# Patient Record
Sex: Male | Born: 1940
Health system: Southern US, Community
[De-identification: ages and names within clinical notes are randomized; demographics above are authoritative.]

## PROBLEM LIST (undated history)

## (undated) DIAGNOSIS — E785 Hyperlipidemia, unspecified: Secondary | ICD-10-CM

## (undated) DIAGNOSIS — K635 Polyp of colon: Secondary | ICD-10-CM

## (undated) DIAGNOSIS — Z87891 Personal history of nicotine dependence: Secondary | ICD-10-CM

## (undated) DIAGNOSIS — C61 Malignant neoplasm of prostate: Secondary | ICD-10-CM

## (undated) DIAGNOSIS — E119 Type 2 diabetes mellitus without complications: Secondary | ICD-10-CM

## (undated) DIAGNOSIS — J449 Chronic obstructive pulmonary disease, unspecified: Secondary | ICD-10-CM

## (undated) DIAGNOSIS — N529 Male erectile dysfunction, unspecified: Secondary | ICD-10-CM

## (undated) DIAGNOSIS — I2510 Atherosclerotic heart disease of native coronary artery without angina pectoris: Secondary | ICD-10-CM

## (undated) HISTORY — DX: Type 2 diabetes mellitus without complications: E11.9

## (undated) HISTORY — DX: Male erectile dysfunction, unspecified: N52.9

## (undated) HISTORY — DX: Personal history of nicotine dependence: Z87.891

## (undated) HISTORY — PX: CATARACT EXTRACTION, BILATERAL: SHX1313

## (undated) HISTORY — DX: Atherosclerotic heart disease of native coronary artery without angina pectoris: I25.10

## (undated) HISTORY — DX: Chronic obstructive pulmonary disease, unspecified: J44.9

## (undated) HISTORY — DX: Hyperlipidemia, unspecified: E78.5

## (undated) HISTORY — PX: PROSTATE BIOPSY: SHX241

## (undated) HISTORY — DX: Polyp of colon: K63.5

---

## 2002-04-18 DIAGNOSIS — D229 Melanocytic nevi, unspecified: Secondary | ICD-10-CM

## 2002-04-18 HISTORY — DX: Melanocytic nevi, unspecified: D22.9

## 2003-11-05 ENCOUNTER — Encounter: Admission: RE | Admit: 2003-11-05 | Discharge: 2003-11-05 | Payer: Self-pay | Admitting: Family Medicine

## 2004-01-26 DIAGNOSIS — K635 Polyp of colon: Secondary | ICD-10-CM

## 2004-01-26 HISTORY — DX: Polyp of colon: K63.5

## 2004-12-08 ENCOUNTER — Encounter (INDEPENDENT_AMBULATORY_CARE_PROVIDER_SITE_OTHER): Payer: Self-pay | Admitting: *Deleted

## 2004-12-08 ENCOUNTER — Ambulatory Visit (HOSPITAL_COMMUNITY): Admission: RE | Admit: 2004-12-08 | Discharge: 2004-12-08 | Payer: Self-pay | Admitting: Gastroenterology

## 2005-05-20 ENCOUNTER — Ambulatory Visit (HOSPITAL_COMMUNITY): Admission: RE | Admit: 2005-05-20 | Discharge: 2005-05-21 | Payer: Self-pay | Admitting: General Surgery

## 2007-04-26 DIAGNOSIS — Z87891 Personal history of nicotine dependence: Secondary | ICD-10-CM

## 2007-04-26 HISTORY — DX: Personal history of nicotine dependence: Z87.891

## 2007-05-12 ENCOUNTER — Ambulatory Visit (HOSPITAL_COMMUNITY): Admission: RE | Admit: 2007-05-12 | Discharge: 2007-05-12 | Payer: Self-pay | Admitting: Family Medicine

## 2007-05-26 HISTORY — PX: PERCUTANEOUS CORONARY STENT INTERVENTION (PCI-S): SHX6016

## 2007-06-07 ENCOUNTER — Inpatient Hospital Stay (HOSPITAL_BASED_OUTPATIENT_CLINIC_OR_DEPARTMENT_OTHER): Admission: RE | Admit: 2007-06-07 | Discharge: 2007-06-07 | Payer: Self-pay | Admitting: Cardiology

## 2007-06-09 ENCOUNTER — Encounter: Admission: RE | Admit: 2007-06-09 | Discharge: 2007-06-09 | Payer: Self-pay | Admitting: Interventional Cardiology

## 2007-06-09 ENCOUNTER — Ambulatory Visit (HOSPITAL_COMMUNITY): Admission: RE | Admit: 2007-06-09 | Discharge: 2007-06-10 | Payer: Self-pay | Admitting: Interventional Cardiology

## 2007-09-04 ENCOUNTER — Ambulatory Visit: Payer: Self-pay | Admitting: Internal Medicine

## 2007-09-05 ENCOUNTER — Inpatient Hospital Stay (HOSPITAL_COMMUNITY): Admission: EM | Admit: 2007-09-05 | Discharge: 2007-09-05 | Payer: Self-pay | Admitting: Emergency Medicine

## 2008-04-01 ENCOUNTER — Inpatient Hospital Stay (HOSPITAL_COMMUNITY): Admission: AD | Admit: 2008-04-01 | Discharge: 2008-04-02 | Payer: Self-pay | Admitting: Cardiology

## 2008-05-21 ENCOUNTER — Ambulatory Visit: Payer: Self-pay | Admitting: Cardiology

## 2008-08-06 DIAGNOSIS — C4491 Basal cell carcinoma of skin, unspecified: Secondary | ICD-10-CM

## 2008-08-06 HISTORY — DX: Basal cell carcinoma of skin, unspecified: C44.91

## 2009-06-22 ENCOUNTER — Emergency Department (HOSPITAL_COMMUNITY): Admission: EM | Admit: 2009-06-22 | Discharge: 2009-06-22 | Payer: Self-pay | Admitting: Emergency Medicine

## 2009-09-07 IMAGING — CR DG CHEST 2V
2 series · 2 of 2 positions shown · non-contrast
Comparison: PA and lateral chest 05/12/2007 and 05/13/2005.

CLINICAL DATA: Chest pain.

CHEST - 2 VIEW

[w chest pa]
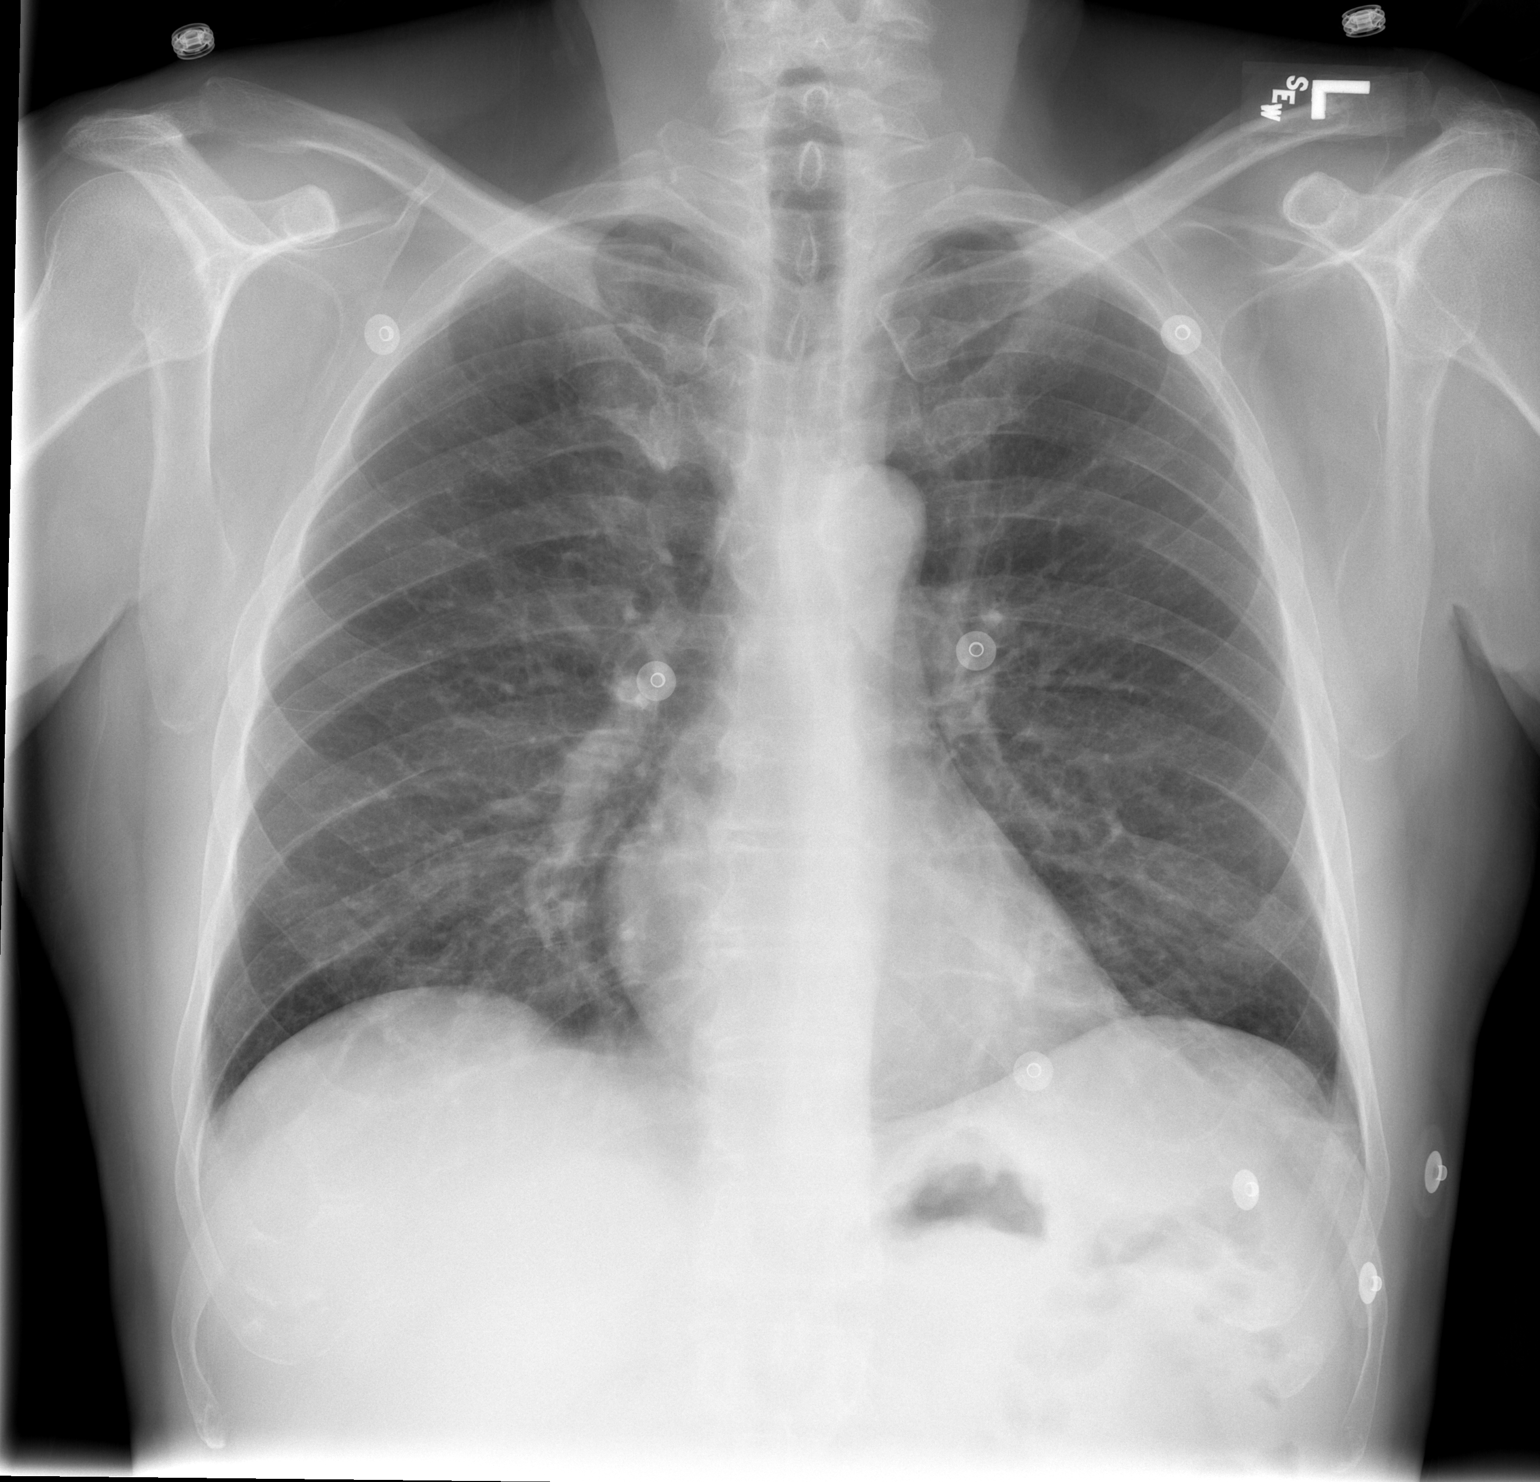

[w chest lat]
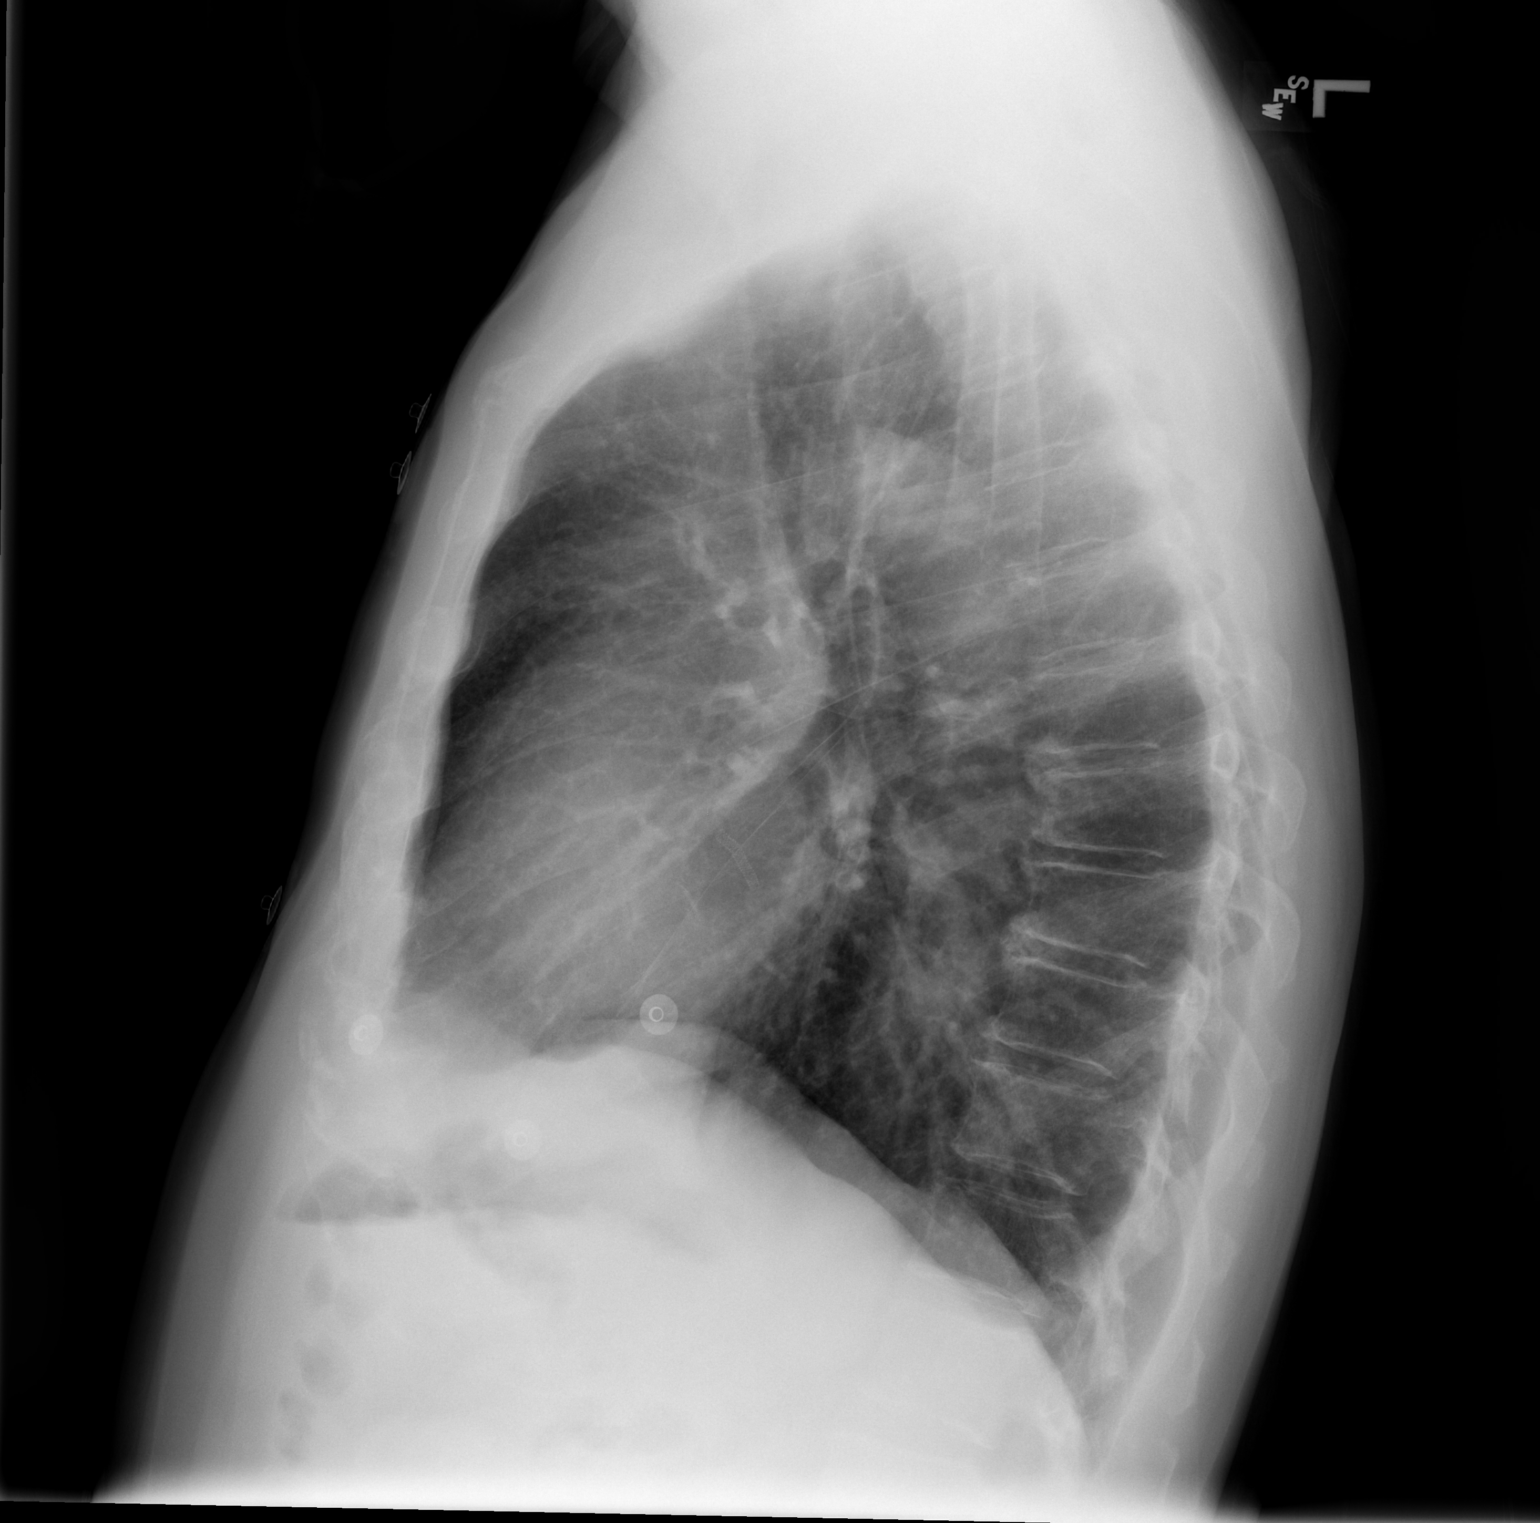

[2 of 2 positions shown; findings below may reference images not displayed]

FINDINGS: Changes of emphysema are again noted.  There is no focal
airspace disease or effusion.  Heart size normal.  No focal bony
abnormality.
IMPRESSION: Emphysema without acute disease.

## 2010-04-04 IMAGING — CR DG CHEST 1V PORT
1 series · 1 of 1 positions shown · non-contrast
Comparison: Chest x-ray of 09/05/2007

CLINICAL DATA: Chest pain

PORTABLE CHEST - 1 VIEW

[view not recorded]
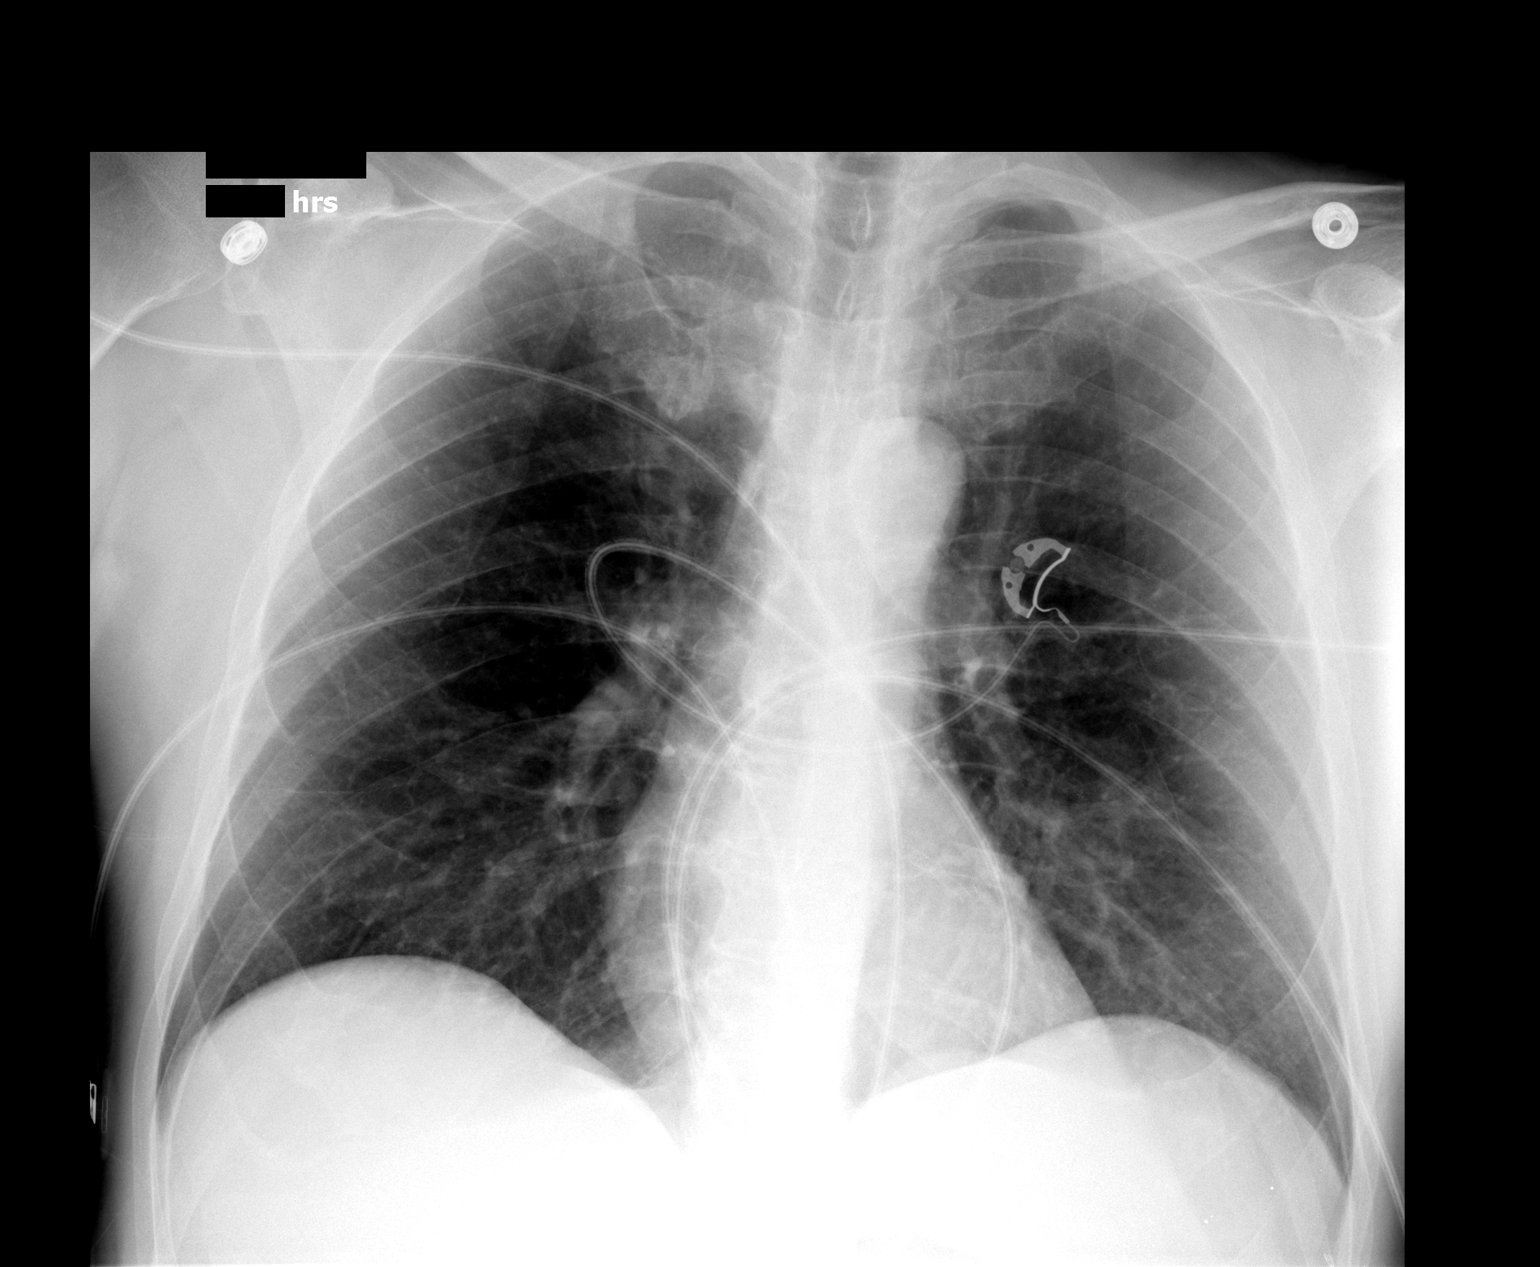

[1 of 1 positions shown; findings below may reference images not displayed]

FINDINGS: The lungs are clear but hyperaerated.  The heart is
within normal limits in size.  No bony abnormality is seen.
IMPRESSION: No active lung disease.  Hyperaeration.

## 2010-05-07 LAB — CBC
HCT: 40.6 % (ref 39.0–52.0)
Hemoglobin: 14.2 g/dL (ref 13.0–17.0)
Hemoglobin: 14.3 g/dL (ref 13.0–17.0)
MCHC: 35.3 g/dL (ref 30.0–36.0)
MCHC: 35.6 g/dL (ref 30.0–36.0)
MCV: 93.8 fL (ref 78.0–100.0)
Platelets: 227 10*3/uL (ref 150–400)
RBC: 4.25 MIL/uL (ref 4.22–5.81)
RDW: 11.6 % (ref 11.5–15.5)
WBC: 6.2 10*3/uL (ref 4.0–10.5)

## 2010-05-07 LAB — CARDIAC PANEL(CRET KIN+CKTOT+MB+TROPI)
CK, MB: 1.7 ng/mL (ref 0.3–4.0)
CK, MB: 3.1 ng/mL (ref 0.3–4.0)
Relative Index: INVALID (ref 0.0–2.5)
Total CK: 49 U/L (ref 7–232)
Troponin I: 0.14 ng/mL — ABNORMAL HIGH (ref 0.00–0.06)

## 2010-05-07 LAB — PROTIME-INR
INR: 2 — ABNORMAL HIGH (ref 0.00–1.49)
Prothrombin Time: 24.1 seconds — ABNORMAL HIGH (ref 11.6–15.2)

## 2010-05-07 LAB — COMPREHENSIVE METABOLIC PANEL
ALT: 41 U/L (ref 0–53)
Alkaline Phosphatase: 46 U/L (ref 39–117)
CO2: 22 mEq/L (ref 19–32)
Creatinine, Ser: 0.58 mg/dL (ref 0.4–1.5)
Glucose, Bld: 133 mg/dL — ABNORMAL HIGH (ref 70–99)
Total Bilirubin: 1.1 mg/dL (ref 0.3–1.2)

## 2010-05-07 LAB — APTT: aPTT: 89 seconds — ABNORMAL HIGH (ref 24–37)

## 2010-05-07 LAB — BASIC METABOLIC PANEL
CO2: 23 mEq/L (ref 19–32)
Glucose, Bld: 174 mg/dL — ABNORMAL HIGH (ref 70–99)

## 2010-05-07 LAB — MAGNESIUM: Magnesium: 2.1 mg/dL (ref 1.5–2.5)

## 2010-05-07 LAB — DIFFERENTIAL
Basophils Relative: 0 % (ref 0–1)
Lymphocytes Relative: 20 % (ref 12–46)
Lymphs Abs: 1.1 10*3/uL (ref 0.7–4.0)
Monocytes Absolute: 0.4 10*3/uL (ref 0.1–1.0)
Neutro Abs: 3.8 10*3/uL (ref 1.7–7.7)

## 2010-05-07 LAB — LIPID PANEL
Cholesterol: 105 mg/dL (ref 0–200)
HDL: 22 mg/dL — ABNORMAL LOW (ref >39)
Total CHOL/HDL Ratio: 4.8 RATIO
Triglycerides: 199 mg/dL — ABNORMAL HIGH (ref <150)

## 2010-05-07 LAB — GLUCOSE, CAPILLARY

## 2010-06-09 NOTE — Cardiovascular Report (Signed)
[name], [name]              ACCOUNT NO.:  [id number]   MEDICAL RECORD NO.:  [id number]          PATIENT TYPE:  INP   LOCATION:  2508                         FACILITY:  [facility]   PHYSICIAN:  Armanda Magic, M.D.     DATE OF BIRTH:  11-30-1940   DATE OF PROCEDURE:  04/02/2008  DATE OF DISCHARGE:  04/02/2008                            CARDIAC CATHETERIZATION   PROCEDURES:  1. Left heart catheterization.  2. Coronary angiography.  3. Left ventriculography.   OPERATOR:  Armanda Magic, MD   INDICATIONS:  Unstable angina.   COMPLICATIONS:  None.   INTRAVENOUS MEDICATIONS:  Versed 1 mg, fentanyl 25 mcg IV access via  right femoral artery 5-French sheath.   This is a 70 year old male who has a history of a PCI stent to the left  circumflex in May 2009, who presented back today with episodes of  exertional and rest angina, relieved with nitroglycerin.  He now  presents for cardiac catheterization.   The patient was brought to the cardiac catheterization laboratory in a  fasting nonsedated state.  An informed consent was obtained.  The  patient was connected to continuous heart rate and pulse oximetry  monitoring and written blood pressure monitoring.  The right groin was  prepped and draped in a sterile fashion.  Xylocaine 1% was used for  local anesthesia.  Using modified Seldinger technique, a 5-French sheath  was placed in right femoral artery.  Under fluoroscopic guidance, a 5-  [place] JL-4 catheter was placed in left coronary artery.  Multiple cine  films were taken at 30-degree RAO and 40-degree LAO views.  This  catheter was exchanged out over a guidewire for a 5-French JR-4  catheter, which was placed under fluoroscopic guidance in the right  coronary artery.  Multiple cine films were taken at 30-degree RAO and 40-  degree LAO views.  His catheter was then exchanged out over a guidewire  for a 5-French angled pigtail catheter, which was placed under  fluoroscopic guidance  in left ventricular cavity.  Left ventriculography  was performed in the 30-degree RAO view using total of 30 mL of contrast  at 15 mL per second.  The catheter was then pulled back across the  aortic valve with no significant gradient noted.  At the end of  procedure, the sheath was exchanged out over a guidewire for a 6-French  sheath and the patient was on to PCI of the left circumflex.   RESULTS:  The left main coronary artery is widely patent, bifurcates in  left anterior descending artery and left circumflex artery.   Left anterior descending artery is widely patent throughout its course  of the apex giving rise to a first diagonal, which is widely patent and  bifurcates into 2 daughter branches, both of which are widely patent.   The left circumflex is patent in its proximal portion giving rise to a  first obtuse marginal branch, which is a small branch.  Just proximal to  the mid left circumflex stent, there is a 99% stenosis of the left  circumflex.  The stent itself is widely patent and  distally gives rise  to a second obtuse marginal branch, which is widely patent.   The right coronary is widely patent and distally bifurcates into  posterior descending artery and posterior lateral artery, both of which  are widely patent.  1. Assess an one-vessel obstructive coronary artery disease to      proximal left circumflex.  2. Normal LV function.  3. Unstable angina.   PLAN:  PCI of the left circumflex, aspirin and Plavix.  Check fasting  lipid panel.      Armanda Magic, M.D.  Electronically Signed     TT/MEDQ  D:  04/02/2008  T:  04/02/2008  Job:  [id number]

## 2010-06-09 NOTE — H&P (Signed)
NAMECHANANYA, CANIZALEZ              ACCOUNT NO.:  [id number]   MEDICAL RECORD NO.:  [id number]          PATIENT TYPE:  INP   LOCATION:  3741                         FACILITY:  [facility]   PHYSICIAN:  Wendi Snipes, MD DATE OF BIRTH:  [birth year]   DATE OF ADMISSION:  09/04/2007  DATE OF DISCHARGE:                              HISTORY & PHYSICAL   CARDIOLOGIST:  Dr. Ladona Ridgel   CHIEF COMPLAINTS:  Chest pain.   HISTORY OF PRESENT ILLNESS:  This is a 70 year old white male with  history of coronary disease status post drug-eluting stent to the mid-  circ in May 2009 after he had a positive stress test.  He presents here  with acute onset of chest pain.  He states these symptoms are similar to  those which led to the stent in May and started this morning. He  describes chest pressure without radiation. There is some positional  element and there is a question of reproducibility. The symptoms  continued throughout the day and waxed and waned having brief periods of  severe chest pain.  He denies any associated shortness of breath,  diaphoresis, syncope, presyncope, increased lower extremity edema,  paroxysmal nocturnal dyspnea, orthopnea.  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  He was  prompted by his wife who recognized that his symptoms were similar to  his symptoms before.  He has been taking all his medications including  his Plavix and his stent placement   PAST MEDICAL HISTORY:  1. Coronary disease status post drug-eluting stent to the mid-circ and      into the OM2 May 2009 after exercise Cardiolite showed no evidence      of inducible ischemia.  However, his EKG had evidence of ischemia.  2. Diabetes, currently diet-controlled.  3. Hyperlipidemia/hypertriglyceridemia.   SOCIAL HISTORY:  The patient was in [place].  He does not smoke.  Denies alcohol  use.   FAMILY HISTORY:  Significant premature coronary artery disease.   REVIEW OF SYSTEMS:  All 14 systems were reviewed and were negative  except as mentioned in HPI.   MEDICATIONS ON ADMISSION:  1. Plavix 75 mg daily.  2. CREST 04/05 mg daily.  3. Aspirin.  25 mg daily.  4. Sublingual nitroglycerin as directed.   PHYSICAL EXAMINATION:  VITAL SIGNS:  Blood pressure was 117/67,  respiratory rate 18, pulse 60 and regular.  GENERAL:  He is a 70 year old white male appearing stated age.  No acute  distress.  HEENT:  Moist mucous membranes.  Pupils equal round, react to light and  accommodation.  Anicteric sclera.  NECK:  No jugular venous distention.  No thyromegaly.  CARDIOVASCULAR:  Regular rate and rhythm.  No murmurs, rubs, gallops.  LUNGS:  Clear to auscultation bilaterally and nontender, nondistended.  Positive bowel sounds.  No masses.  EXTREMITIES:  No clubbing, cyanosis, edema, 2+ pulses throughout.  SKIN:  Warm, dry, intact.  No rashes.  NEUROLOGICAL:  Alert and oriented x3.  Cranial nerves II-XII grossly  intact.  No focal neurologic deficits.  Psych, mood and affect  appropriate.   STUDIES:  Radiology is pending.  EKG showed normal sinus rhythm with no  ST or T-wave abnormalities suggestive of any recent ongoing ischemia.   LABORATORY:  White blood cell count is 5.5, hematocrit 41, platelets  255.  Comprehensive metabolic panel was pending.  Troponin 0.02.   ASSESSMENT/PLAN:  This is a 70 year old white male with a recent drug-  eluting stent placement with symptoms concerning for unstable angina.  1. Unstable Angina. Will continue his current aspirin and Plavix and  treat per ACS protocol with beta-blocker, ACE inhibitor and full-dose  heparin.  This is very unlikely stent thrombosis.  However, we will  cycle cardiac enzymes, rule out MI.  Consider repeating his Cardiolite  stress test in the morning.  However, if the symptoms continue may  benefit from a cardiac  cath to assess in-stent restenosis which is also  very unlikely.  His symptoms are also possibly musculoskeletal and  elicit reproducibility on exam.      Wendi Snipes, MD  Electronically Signed     BHH/MEDQ  D:  09/05/2007  T:  09/05/2007  Job:  [id number]

## 2010-06-09 NOTE — Discharge Summary (Signed)
[name], [name]              ACCOUNT NO.:  [id number]   MEDICAL RECORD NO.:  [id number]          PATIENT TYPE:  INP   LOCATION:  2508                         FACILITY:  [facility]   PHYSICIAN:  Armanda Magic, M.D.     DATE OF BIRTH:  1940/06/20   DATE OF ADMISSION:  04/01/2008  DATE OF DISCHARGE:  04/02/2008                               DISCHARGE SUMMARY   DISCHARGE DIAGNOSES:  1. Single-vessel coronary artery disease, status post drug-eluting      stent to the proximal circumflex.  2. Dyslipidemia.  3. Diet-controlled diabetes.  4. Prior history of tobacco abuse, smoking cessation counseling.   HOSPITAL COURSE:  [name] is a 70 year old male patient who came  into the office on April 01, 2008, complaining of bilateral chest pain.  Sublingual nitroglycerin helped it.  It is worse with activity and he  has had accompanying shortness of breath with this.  He stated that  these symptoms were not similar to his previous stent in the past.   We went ahead and admitted him to the hospital for cardiac  catheterization.  On April 01, 2008, he was found to have a subtotaled  circumflex lesion.  Dr. Eldridge Dace then placed a drug-eluting stent to the  proximal circumflex lesion and after that the patient remained on  aspirin and Plavix for at least 1 year.  The patient went home in the  following day in stable, but improved condition.   DISCHARGE LABORATORIES:  Maximum CK 55 with MB fraction of 2.8, and  troponin 0.14.  Sodium 137, potassium 4.2, BUN 8, and creatinine 0.68.  Total cholesterol 105, triglycerides 199, HDL 22, and LDL 43.  Hemoglobin 14.3, hematocrit 40.6, platelets 227, and white count 6.2.  TSH 4.514 and magnesium 2.1.   Chest x-ray:  No active lung disease, hyperaeration.   DISCHARGE MEDICATIONS:  1. Plavix 75 mg 1 p.o. daily.  2. Aspirin 325 mg.  3. Crestor 5 mg a day.   Remain on low-sodium, heart-healthy diet.  Increase activity slowly.  No  lifting for 1 week.   No driving for 1-day.  Follow up with Dr. Mayford Knife  for fasting lab work on April 17, 2008, at 9:45 a.m. and then to see her  at 10:45 the same day.      Guy Franco, P.A.      Armanda Magic, M.D.  Electronically Signed    LB/MEDQ  D:  05/16/2008  T:  05/17/2008  Job:  [id number]   cc:   Jethro Bastos, M.D.

## 2010-06-09 NOTE — Cardiovascular Report (Signed)
NAMEJEROD, MCQUAIN              ACCOUNT NO.:  [id number]   MEDICAL RECORD NO.:  [id number]          PATIENT TYPE:  OIB   LOCATION:  1961                         FACILITY:  [facility]   PHYSICIAN:  Armanda Magic, M.D.     DATE OF BIRTH:  08-Feb-1940   DATE OF PROCEDURE:  06/07/2007  DATE OF DISCHARGE:  06/07/2007                            CARDIAC CATHETERIZATION   REFERRING PHYSICIAN:  Jethro Bastos, MD   PROCEDURE:  1. Left heart catheterization.  2. Coronary angiography.  3. Left ventriculography.   OPERATOR:  Armanda Magic, MD   INDICATIONS:  Chest pain.   COMPLICATIONS:  None.   IV MEDICATIONS:  Fentanyl 25 mcg, Versed 1 mg IV access via right  femoral artery 4-French sheath.   This is a 70 year old male with a 4-6 week history of intermittent upper  chest pain described as an aching sensation usually lasting about 5  minutes at a time.  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  He underwent a stress Cardiolite study which  showed no inducible ischemia by SPECT edges but was positive by EKG and  the patient did have 3/10 chest pain during the stress test.  He now  presents for cardiac catheterization.   The patient was brought to the cardiac catheterization laboratory in a  fasting nonsedated state.  Informed consent was obtained.  The patient  was connected to continuous heart rate and pulse oximetry monitoring and  intermittent blood pressure monitoring.  The right groin was prepped and  draped in sterile fashion.  1% Xylocaine was used for local anesthesia.  Using the modified Seldinger technique, a 4-French sheath was placed in  the right femoral artery.  Under fluoroscopic guidance, a 4-French JL-4  catheter was placed in the left coronary artery.  Multiple cine films  were taken at 30-degree RAO and 40-degree LAO views.  This catheter was  then exchanged out over a guidewire for 4-French 3D right coronary  artery catheter.  Multiple  cine films were taken at 30-degree RAO and 40-  degree LAO views.  This catheter was then exchanged out over a guidewire  for a 4-French angled pigtail catheter which was placed in the left  ventricular cavity.  Left ventriculography was performed in the 30-  degree RAO view using a total of 30 mL of contrast at 15 mL per second.  The catheter was then pulled back across the aortic valve with no  significant gradient noted.  At the end of the procedure, all catheters  and sheaths were removed.  Manual compression was performed.  Adequate  hemostasis was obtained.  After adequate hemostasis was obtained, the  patient was transferred back to room in stable condition.  Of note, the  patient did develop some chest pain after being taken off the table.  EKG was done which showed normal sinus rhythm with no ST changes.  The  patient was given 1 sublingual nitroglycerin with resolution of his  chest pain.   RESULTS:  1. Left main coronary artery is widely patent and bifurcates in the  left anterior descending artery and left circumflex artery.   Left anterior descending artery is widely patent throughout the course  of the apex giving rise to a first diagonal branch.  The first diagonal  branch bifurcates in 2 daughter branches, both of which are widely  patent.   The left circumflex is widely patent throughout its course proximally  giving rise to a first obtuse marginal branch which is widely patent.  The mid left circumflex has a 95% stenosis.  It then gives rise to a  second obtuse marginal branch which has a 90% mid stenosis before  bifurcating in 2 daughter branches.  The ongoing circumflex is patent  but small.   1. The right coronary artery is widely patent throughout its course.      It distally bifurcates into posterior descending artery and      posterior lateral artery both of which are widely patent.   1. Left ventriculography shows normal LV function.  LV pressure  118/10      mmHg, aortic pressure 115/64 mmHg, LVEDP 12 mmHg.   ASSESSMENT:  1. One-vessel obstructive coronary disease.  The left circumflex SPECT      images on stress Cardiolite study showed no inducible ischemia.      EKG was positive for ischemia.  The patient is now pain free after      having an episode of chest pain resolved with 1 sublingual      nitroglycerin and no EKG changes noted.  2. Normal left ventricular function.  3. Chest pain.  4. Diabetes mellitus.   PLAN:  1. Discharge to home.  2. We will put him on Plavix 75 mg a day and aspirin 325 mg a day as      well as Imdur 30 mg a day and send him home with sublingual      nitroglycerin.  3. He will be scheduled tomorrow, May 14, for PCI of the left      circumflex by Dr. Eldridge Dace.  He has been informed that if he has      any chest pain while at home today or tonight that is not relieved      after 3 sublingual nitroglycerins, then he is to call 911.      Armanda Magic, M.D.  Electronically Signed     TT/MEDQ  D:  06/07/2007  T:  06/08/2007  Job:  [id number]   cc:   Jethro Bastos, M.D.

## 2010-06-09 NOTE — Discharge Summary (Signed)
NAMEDEONTRA, PEREYRA              ACCOUNT NO.:  [id number]   MEDICAL RECORD NO.:  [id number]          PATIENT TYPE:  INP   LOCATION:  3741                         FACILITY:  [facility]   PHYSICIAN:  Doylene Canning. Ladona Ridgel, MD    DATE OF BIRTH:  [birth year]   DATE OF ADMISSION:  09/04/2007  DATE OF DISCHARGE:  09/05/2007                               DISCHARGE SUMMARY   DISCHARGE DIAGNOSES:  1. Chest pain, resolved.  2. Known coronary artery disease.  3. Dyslipidemia.  4. Diabetes mellitus, diet controlled.  5. Hyperlipidemia.  6. Long-term medication use.   HOSPITAL COURSE:  [name] is a 70 year old male patient with a  known history of coronary artery disease.  He received a drug-eluting  stent to the circumflex in May 2009 after positive stress test.  He  complains of chest pain similar to that which led to the stent.  Chest  pain started on the morning September 04, 2007, and waxed and waned all  day.  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  He ended up coming to the  emergency room and he remained in the hospital overnight.  His cardiac  enzymes were negative x3.  Because his cardiac enzymes were negative x3,  his EKG is nonischemic and the fact that he is playing the fiddle for 3  hours in an uncomfortable position, it was felt that it was safe for him  to go home with a follow up with his stress Cardiolite on this Friday.   The patient is to go home on his current medication regimen which  includes:  1. Plavix 75 mg a day.  2. Aspirin 325 mg a day.  3. Crestor 5 mg a day.  4. Sublingual nitroglycerin p.r.n. chest pain.   ACTIVITY:  As tolerated.   DIET:  Remain on low-sodium, heart-healthy diabetic diet.   The patient is to return for a stress test on September 08, 2007, at 12:30  p.m.  The patient is not to eat before the test and no caffeine for 24  hours before the study.   DISCHARGE TIME:  Greater  than 30 minutes.      Guy Franco, P.A.      Doylene Canning. Ladona Ridgel, MD  Electronically Signed    LB/MEDQ  D:  09/05/2007  T:  09/06/2007  Job:  [id number]   cc:   Armanda Magic, M.D.

## 2010-06-12 NOTE — Op Note (Signed)
[name], [name]              ACCOUNT NO.:  [id number]   MEDICAL RECORD NO.:  [id number]          PATIENT TYPE:  OIB   LOCATION:  1512                         [facility]ACILITY:  [facility]   PHYSICIAN:  Adolph Pollack, M.D.DATE OF BIRTH:  07-10-1940   DATE OF PROCEDURE:  05/20/2005  DATE OF DISCHARGE:                                 OPERATIVE REPORT   PREOP DIAGNOSIS:  Bilateral inguinal hernias with a right side being  recurrent.   POSTOPERATIVE DIAGNOSIS:  Bilateral inguinal hernias with a right side being  recurrent.   PROCEDURE:  Laparoscopic bilateral inguinal hernia repair with mesh (right  side recurrent).   SURGEON:  Adolph Pollack, M.D.   ASSISTANT:  None.   ANESTHESIA:  General.   INDICATION:  [name] is a 70 year old male who had a right inguinal  hernia repair back in 1998 with mesh.  He has been noted to have recurrent  right inguinal hernia that is somewhat symptomatic and he had left inguinal  pain and also had a simple left inguinal hernia that was detected on exam.  He now presents for repair as above.  We discussed the procedure, the risks  and aftercare preoperatively.   TECHNIQUE:  He is seen in the holding area and brought to the operating  room, placed supine on the operating table.  General anesthetic was  administered.  The hair on the abdominal wall was clipped and the area  sterilely prepped and draped.  Dilute Marcaine solution was infiltrated in  the subumbilical region and a subumbilical incision was made through skin  and subcutaneous tissue.  Using blunt dissection I identified the left  anterior rectus sheath and made a small incision in it.  The underlying  rectus muscle was swept laterally using blunt dissection exposing the  posterior rectus sheath.  A balloon dissection device was then placed in the  extraperitoneal space under laparoscopic vision.  The balloon dissection was  performed.   The balloon dissection device was removed.   The CO2 gas was insufflated  through the trocar and laparoscope was introduced and the extraperitoneal  space visualized.  Under direct vision two 5 mm trocars were placed through  lower midline incisions.  We used blunt dissection to expose the symphysis  pubis and Cooper's ligament bilaterally.  I then used blunt dissection to  dissect fibrofatty tissue free from the anterior and lateral abdominal wall  at the level of the umbilicus on the left side.  I noted a direct hernia of  the left side.  I then isolated the spermatic cord.  I did strip the  peritoneum back from the cord to the level of the umbilicus.   Following this, I approached the right side.  I began using blunt dissection  to dissect the fibrofatty tissue away from the anterior lateral abdominal  wall up to level of the umbilicus.  A direct defect was noted.  I then  stripped the peritoneum off the spermatic cord but a hole was made in the  peritoneum.  I was able to strip the peritoneum off the cord and then pull  the anterior portion of the peritoneum over to cover the defect and  peritoneum.  I did insert a Veress needle through the periumbilical incision  to release some of the pneumoperitoneum.   The spermatic cord was then visualized.  The spermatic cord was then  isolated and a window created around it as had been done on the left side.  I reapproached the left side.  I brought a 6x6 inch piece of mesh and cut it  to be 5x6.  I then cut a longitudinal slit partially through the mesh  inserted into the left extraperitoneal space.  I then positioned it so that  the two tails of the mesh were wrapped around the spermatic cord.  I then  anchored the mesh to the Cooper's ligament anterior lateral abdominal walls  with spiral tacks which provided more than adequate coverage of the direct  defect as well as indirect and femoral spaces.   I then took another piece of mesh, cut a partial longitudinal slit into and  cut it  to be 5x6 inches.  It was then inserted into the right  extraperitoneal space and positioned as it was on the left side.  I anchored  it to Cooper's ligament, the anterior and lateral abdominal walls with  spiral tacks.  This again provided more than adequate coverage of the  direct, indirect and femoral spaces.   Following this I noted hemostasis to be adequate.  I then held down the  inferolateral aspects of both pieces of mesh and then released the CO2 gas.  I then removed all trocars and instruments.   The left anterior rectus sheath defect was then closed with interrupted 0  Vicryl sutures.  The skin incisions were closed with 4-0 Monocryl  subcuticular stitches followed by Steri-Strips and sterile dressings.  He  tolerated the procedure well without apparent complications was taken to  recovery in satisfactory condition.      Adolph Pollack, M.D.  Electronically Signed     TJR/MEDQ  D:  05/20/2005  T:  05/21/2005  Job:  [id number]   cc:   Schuyler Amor, M.D.  Fax: [phone]

## 2010-10-21 LAB — BASIC METABOLIC PANEL
BUN: 6
Calcium: 9.3
Chloride: 105
Creatinine, Ser: 0.73
GFR calc non Af Amer: >60
Glucose, Bld: 139 — ABNORMAL HIGH
Sodium: 139

## 2010-10-21 LAB — LIPID PANEL
Cholesterol: 188
HDL: 31 — ABNORMAL LOW
Total CHOL/HDL Ratio: 6.1
VLDL: 70 — ABNORMAL HIGH

## 2010-10-21 LAB — CBC
MCHC: 35.2
MCV: 92.7
RBC: 4.3

## 2010-10-21 LAB — CARDIAC PANEL(CRET KIN+CKTOT+MB+TROPI)
CK, MB: 1.2
Relative Index: INVALID
Total CK: 30

## 2010-10-21 LAB — HEMOGLOBIN A1C
Hgb A1c MFr Bld: 7 — ABNORMAL HIGH
Mean Plasma Glucose: 172

## 2010-10-23 LAB — CARDIAC PANEL(CRET KIN+CKTOT+MB+TROPI)
CK, MB: 2.8
Relative Index: INVALID
Total CK: 64

## 2010-10-23 LAB — COMPREHENSIVE METABOLIC PANEL
ALT: 36
AST: 23
Calcium: 8.7
GFR calc Af Amer: >60
Sodium: 137
Total Protein: 6.1

## 2010-10-23 LAB — CBC
HCT: 41.6
Hemoglobin: 14.3
MCHC: 34.4
MCV: 94.1
Platelets: 255
RBC: 4.42
RDW: 11.6
WBC: 5.5

## 2010-10-23 LAB — DIFFERENTIAL
Basophils Absolute: 0
Basophils Relative: 1
Eosinophils Absolute: 0.3
Eosinophils Relative: 6 — ABNORMAL HIGH
Lymphocytes Relative: 38
Lymphs Abs: 2.1
Monocytes Absolute: 0.6
Monocytes Relative: 10
Neutro Abs: 2.5
Neutrophils Relative %: 45

## 2010-10-23 LAB — HEPARIN LEVEL (UNFRACTIONATED): Heparin Unfractionated: 0.71 — ABNORMAL HIGH

## 2010-10-23 LAB — CK TOTAL AND CKMB (NOT AT ARMC)
CK, MB: 2.8
Relative Index: INVALID
Total CK: 91

## 2010-10-23 LAB — POCT CARDIAC MARKERS
CKMB, poc: 2.1
Myoglobin, poc: 61.5
Troponin i, poc: <0.05

## 2010-10-23 LAB — APTT: aPTT: >200

## 2010-10-23 LAB — TROPONIN I: Troponin I: 0.02

## 2011-03-02 ENCOUNTER — Other Ambulatory Visit: Payer: Self-pay | Admitting: Physician Assistant

## 2011-12-07 ENCOUNTER — Other Ambulatory Visit: Payer: Self-pay | Admitting: Gastroenterology

## 2011-12-07 DIAGNOSIS — R1310 Dysphagia, unspecified: Secondary | ICD-10-CM

## 2011-12-20 ENCOUNTER — Ambulatory Visit
Admission: RE | Admit: 2011-12-20 | Discharge: 2011-12-20 | Disposition: A | Discharge status: Home / Self Care | Payer: Medicare Other | Source: Ambulatory Visit | Attending: Gastroenterology | Admitting: Gastroenterology

## 2011-12-20 DIAGNOSIS — R1310 Dysphagia, unspecified: Secondary | ICD-10-CM

## 2012-11-30 ENCOUNTER — Ambulatory Visit: Payer: Medicare Other | Admitting: Cardiology

## 2012-11-30 ENCOUNTER — Other Ambulatory Visit: Payer: Medicare Other

## 2012-12-20 ENCOUNTER — Encounter: Payer: Self-pay | Admitting: General Surgery

## 2012-12-20 DIAGNOSIS — E7800 Pure hypercholesterolemia, unspecified: Secondary | ICD-10-CM | POA: Insufficient documentation

## 2012-12-20 DIAGNOSIS — Z79899 Other long term (current) drug therapy: Secondary | ICD-10-CM | POA: Insufficient documentation

## 2012-12-20 DIAGNOSIS — I2510 Atherosclerotic heart disease of native coronary artery without angina pectoris: Secondary | ICD-10-CM

## 2012-12-25 ENCOUNTER — Encounter (INDEPENDENT_AMBULATORY_CARE_PROVIDER_SITE_OTHER): Payer: Self-pay

## 2012-12-25 ENCOUNTER — Other Ambulatory Visit: Payer: Medicare Other

## 2012-12-25 ENCOUNTER — Ambulatory Visit (INDEPENDENT_AMBULATORY_CARE_PROVIDER_SITE_OTHER): Payer: Medicare Other | Admitting: Cardiology

## 2012-12-25 ENCOUNTER — Encounter: Payer: Self-pay | Admitting: Cardiology

## 2012-12-25 VITALS — BP 118/68 | HR 67 | Ht 68.0 in | Wt 145.0 lb

## 2012-12-25 DIAGNOSIS — E7800 Pure hypercholesterolemia, unspecified: Secondary | ICD-10-CM

## 2012-12-25 DIAGNOSIS — I2510 Atherosclerotic heart disease of native coronary artery without angina pectoris: Secondary | ICD-10-CM

## 2012-12-25 DIAGNOSIS — R002 Palpitations: Secondary | ICD-10-CM

## 2012-12-25 NOTE — Patient Instructions (Signed)
Your physician recommends that you continue on your current medications as directed. Please refer to the Current Medication list given to you today.  Your physician recommends that you return for fasting lab work on 01/02/13.  Your physician wants you to follow-up in: 1 Year with Dr Sherlyn Lick will receive a reminder letter in the mail two months in advance. If you don't receive a letter, please call our office to schedule the follow-up appointment.

## 2012-12-25 NOTE — Progress Notes (Signed)
[address] [place], [place]  [address] Phone: [phone] Fax:  [phone]  Date:  12/25/2012   ID:  [name], DOB [birth year], MRN [id number]  PCP:  Darrow Bussing, MD  Cardiologist:  Armanda Magic, MD     History of Present Illness: [name] is a 72 y.o. male with a history of ASCAD and dyslipidemia.  He is doing well  He denies any chest pain, SOB, DOE, LE edema, dizziness or syncope.  He says for about 4-5 days in a row he had palpitations out of nowhere.  They have been intermittent throughout the day.  He has not noticed them in the past 24 hours.  He thinks he may have been drinking too much caffeine lately.   Wt Readings from Last 3 Encounters:  12/25/12 145 lb (65.772 kg)  12/20/12 138 lb 3.2 oz (62.687 kg)     Past Medical History  Diagnosis Date  . Dyslipidemia   . Diabetes mellitus without complication     Diet-controlled non-insulin-dependent  . History of tobacco abuse 04/2007    COPD on CXR  . Erectile dysfunction   . Colon polyp 2006    repeat in 2 years  . CAD (coronary artery disease)     One vessel CAD status post PCI of left circumflex aqnd to the second obtuse marginal 2009 and PCI of left circ 03/2008 for stenosis above the prior stent  . Elevated cholesterol   . COPD (chronic obstructive pulmonary disease)     Current Outpatient Prescriptions  Medication Sig Dispense Refill  . Ascorbic Acid (VITAMIN C) 100 MG tablet Take 100 mg by mouth every other day.      Marland Kitchen aspirin EC 81 MG tablet Take 81 mg by mouth daily.      . clopidogrel (PLAVIX) 75 MG tablet Take 75 mg by mouth daily with breakfast.      . lisinopril (PRINIVIL,ZESTRIL) 2.5 MG tablet Take 2.5 mg by mouth daily.      . metFORMIN (GLUCOPHAGE) 500 MG tablet Take 1,000 mg by mouth 2 (two) times daily with a meal.      . Multiple Vitamin (MULTIVITAMIN WITH MINERALS) TABS tablet Take 1 tablet by mouth daily.      . nitroGLYCERIN (NITROSTAT) 0.4 MG SL tablet Place 0.4 mg  under the tongue every 5 (five) minutes as needed for chest pain.      . rosuvastatin (CRESTOR) 20 MG tablet Take 20 mg by mouth daily.      . sildenafil (VIAGRA) 50 MG tablet Take 50 mg by mouth daily as needed for erectile dysfunction.      . Vitamin D, Cholecalciferol, 1000 UNITS TABS Take 1 tablet by mouth daily.       No current facility-administered medications for this visit.    Allergies:    Allergies  Allergen Reactions  . Sulfa Antibiotics Other (See Comments)    Severe Headaches  . Zocor [Simvastatin] Other (See Comments)    Aches and myalgias    Social History:  The patient  reports that he quit smoking about 4 years ago. His smoking use included Cigarettes. He smoked 0.00 packs per day. He has never used smokeless tobacco. He reports that he drinks alcohol. He reports that he does not use illicit drugs.   Family History:  The patient's family history is not on file.   ROS:  Please see the history of present illness.      All other systems reviewed  and negative.   PHYSICAL EXAM: VS:  BP 118/68  Pulse 67  Ht 5\' 8"  (1.727 m)  Wt 145 lb (65.772 kg)  BMI 22.05 kg/m2 Well nourished, well developed, in no acute distress HEENT: normal Neck: no JVD Cardiac:  normal S1, S2; RRR; no murmur Lungs:  clear to auscultation bilaterally, no wheezing, rhonchi or rales Abd: soft, nontender, no hepatomegaly Ext: no edema Skin: warm and dry Neuro:  CNs 2-12 intact, no focal abnormalities noted  EKG:     NSR  ASSESSMENT AND PLAN:  1. ASCAD with no angina  - continue ASA/Plavix 2. dyslipdiemia  - continue Crestor  - recheck NMR panel and ALT 3.  Palpitations  - He would like to avoid caffeine first and see if he has any reoccurrence before doing a heart monitor  Followup with me in 1 year  Signed, Armanda Magic, MD 12/25/2012 2:49 PM

## 2013-01-02 ENCOUNTER — Other Ambulatory Visit (INDEPENDENT_AMBULATORY_CARE_PROVIDER_SITE_OTHER): Payer: Medicare Other

## 2013-01-02 DIAGNOSIS — E7800 Pure hypercholesterolemia, unspecified: Secondary | ICD-10-CM

## 2013-01-02 LAB — ALT: ALT: 23 U/L (ref 0–53)

## 2013-01-03 ENCOUNTER — Other Ambulatory Visit: Payer: Self-pay | Admitting: General Surgery

## 2013-01-03 ENCOUNTER — Encounter: Payer: Self-pay | Admitting: General Surgery

## 2013-01-03 DIAGNOSIS — I2510 Atherosclerotic heart disease of native coronary artery without angina pectoris: Secondary | ICD-10-CM

## 2013-01-03 DIAGNOSIS — Z79899 Other long term (current) drug therapy: Secondary | ICD-10-CM

## 2013-01-03 LAB — NMR LIPOPROFILE WITH LIPIDS
HDL Size: 8.6 nm — ABNORMAL LOW (ref >=9.2)
HDL-C: 31 mg/dL — ABNORMAL LOW (ref >=40)
LDL (calc): 48 mg/dL (ref <100)
LDL Size: 19.6 nm — ABNORMAL LOW (ref >20.5)
LP-IR Score: 67 — ABNORMAL HIGH (ref <=45)
Large HDL-P: <1.3 umol/L — ABNORMAL LOW (ref >=4.8)
Small LDL Particle Number: 718 nmol/L — ABNORMAL HIGH (ref <=527)

## 2013-01-31 ENCOUNTER — Other Ambulatory Visit: Payer: Self-pay | Admitting: Cardiology

## 2013-02-01 ENCOUNTER — Other Ambulatory Visit: Payer: Self-pay

## 2013-02-01 MED ORDER — CLOPIDOGREL BISULFATE 75 MG PO TABS: 75.0000 mg | ORAL_TABLET | Freq: Every day | ORAL | Status: DC

## 2013-02-09 ENCOUNTER — Other Ambulatory Visit: Payer: Self-pay | Admitting: Cardiology

## 2013-07-04 ENCOUNTER — Other Ambulatory Visit (INDEPENDENT_AMBULATORY_CARE_PROVIDER_SITE_OTHER): Payer: Medicare Other

## 2013-07-04 DIAGNOSIS — I2510 Atherosclerotic heart disease of native coronary artery without angina pectoris: Secondary | ICD-10-CM

## 2013-07-04 DIAGNOSIS — Z79899 Other long term (current) drug therapy: Secondary | ICD-10-CM

## 2013-07-04 LAB — HEPATIC FUNCTION PANEL
ALT: 26 U/L (ref 0–53)
AST: 21 U/L (ref 0–37)
Albumin: 4.3 g/dL (ref 3.5–5.2)
Alkaline Phosphatase: 36 U/L — ABNORMAL LOW (ref 39–117)
Bilirubin, Direct: 0 mg/dL (ref 0.0–0.3)
TOTAL PROTEIN: 6.8 g/dL (ref 6.0–8.3)
Total Bilirubin: 1 mg/dL (ref 0.2–1.2)

## 2013-07-12 ENCOUNTER — Encounter: Payer: Self-pay | Admitting: General Surgery

## 2013-08-28 ENCOUNTER — Other Ambulatory Visit: Payer: Self-pay | Admitting: Cardiology

## 2013-11-01 ENCOUNTER — Encounter: Payer: Self-pay | Admitting: Cardiology

## 2013-11-20 ENCOUNTER — Other Ambulatory Visit: Payer: Self-pay | Admitting: Physician Assistant

## 2013-11-28 ENCOUNTER — Encounter: Payer: Self-pay | Admitting: Cardiology

## 2014-01-02 ENCOUNTER — Other Ambulatory Visit: Payer: Self-pay | Admitting: Cardiology

## 2014-01-16 ENCOUNTER — Telehealth: Payer: Self-pay | Admitting: Cardiology

## 2014-01-16 NOTE — Telephone Encounter (Signed)
Instructed patient to come fasting to his OV and we will do labs then. Patient agrees to treatment plan.

## 2014-01-16 NOTE — Telephone Encounter (Signed)
New Message  Pt requests a call back to determine if a lab is needed for his OV in February// please call

## 2014-02-02 ENCOUNTER — Other Ambulatory Visit: Payer: Self-pay | Admitting: Cardiology

## 2014-02-04 ENCOUNTER — Other Ambulatory Visit: Payer: Self-pay | Admitting: *Deleted

## 2014-02-04 MED ORDER — CLOPIDOGREL BISULFATE 75 MG PO TABS: ORAL_TABLET | ORAL | Status: DC

## 2014-02-26 ENCOUNTER — Ambulatory Visit (INDEPENDENT_AMBULATORY_CARE_PROVIDER_SITE_OTHER): Payer: Medicare Other | Admitting: Cardiology

## 2014-02-26 ENCOUNTER — Encounter: Payer: Self-pay | Admitting: Cardiology

## 2014-02-26 VITALS — BP 100/72 | HR 62 | Ht 68.0 in | Wt 147.0 lb

## 2014-02-26 DIAGNOSIS — R002 Palpitations: Secondary | ICD-10-CM

## 2014-02-26 DIAGNOSIS — I2583 Coronary atherosclerosis due to lipid rich plaque: Principal | ICD-10-CM

## 2014-02-26 DIAGNOSIS — I2510 Atherosclerotic heart disease of native coronary artery without angina pectoris: Secondary | ICD-10-CM

## 2014-02-26 DIAGNOSIS — E7800 Pure hypercholesterolemia, unspecified: Secondary | ICD-10-CM

## 2014-02-26 NOTE — Progress Notes (Signed)
Cardiology Office Note   Date:  02/26/2014   ID:  [name], DOB April 14, 1940, MRN [id number]  PCP:  Lujean Amel, MD  Cardiologist:   Sueanne Margarita, MD   No chief complaint on file.     History of Present Illness: [name] is a 74 y.o. male with a history of ASCAD and dyslipidemia. He is doing well He denies any chest pain, SOB, DOE, LE edema, dizziness or syncope. His palpitations have resolved.    Past Medical History  Diagnosis Date  . Dyslipidemia   . Diabetes mellitus without complication     Diet-controlled non-insulin-dependent  . History of tobacco abuse 04/2007    COPD on CXR  . Erectile dysfunction   . Colon polyp 2006    repeat in 2 years  . CAD (coronary artery disease)     One vessel CAD status post PCI of left circumflex aqnd to the second obtuse marginal 2009 and PCI of left circ 03/2008 for stenosis above the prior stent  . Elevated cholesterol   . COPD (chronic obstructive pulmonary disease)     Past Surgical History  Procedure Laterality Date  . Percutaneous coronary stent intervention (pci-s)  05/2007    PCI of LCx 03/2008 for stenosis above prior stent     Current Outpatient Prescriptions  Medication Sig Dispense Refill  . Ascorbic Acid (VITAMIN C) 100 MG tablet Take 100 mg by mouth every other day.    Marland Kitchen aspirin EC 81 MG tablet Take 81 mg by mouth daily.    . clopidogrel (PLAVIX) 75 MG tablet TAKE ONE TABLET BY MOUTH DAILY WITH BREAKFAST 30 tablet 5  . CRESTOR 20 MG tablet TAKE ONE TABLET BY MOUTH ONCE DAILY 30 tablet 11  . lisinopril (PRINIVIL,ZESTRIL) 2.5 MG tablet Take 2.5 mg by mouth daily.    . metFORMIN (GLUCOPHAGE) 500 MG tablet Take 1,000 mg by mouth 2 (two) times daily with a meal.    . Multiple Vitamin (MULTIVITAMIN WITH MINERALS) TABS tablet Take 1 tablet by mouth daily.    . nitroGLYCERIN (NITROSTAT) 0.4 MG SL tablet Place 0.4 mg under the tongue every 5 (five) minutes as needed for chest pain.    . sildenafil  (VIAGRA) 50 MG tablet Take 50 mg by mouth daily as needed for erectile dysfunction.    . Vitamin D, Cholecalciferol, 1000 UNITS TABS Take 1 tablet by mouth daily.     No current facility-administered medications for this visit.    Allergies:   Sulfa antibiotics and Zocor    Social History:  The patient  reports that he quit smoking about 5 years ago. His smoking use included Cigarettes. He has never used smokeless tobacco. He reports that he drinks alcohol. He reports that he does not use illicit drugs.   Family History:  The patient's family history includes Arthritis in his mother; Diabetes in his mother; Heart attack in his father.    ROS:  Please see the history of present illness.   Otherwise, review of systems are positive for none.   All other systems are reviewed and negative.    PHYSICAL EXAM: VS:  BP 100/72 mmHg  Pulse 62  Ht 5\' 8"  (1.727 m)  Wt 147 lb (66.679 kg)  BMI 22.36 kg/m2 , BMI Body mass index is 22.36 kg/(m^2). GEN: Well nourished, well developed, in no acute distress HEENT: normal Neck: no JVD, carotid bruits, or masses Cardiac: RRR; no murmurs, rubs, or gallops,no edema  Respiratory:  clear to auscultation  bilaterally, normal work of breathing GI: soft, nontender, nondistended, + BS MS: no deformity or atrophy Skin: warm and dry, no rash Neuro:  Strength and sensation are intact Psych: euthymic mood, full affect   EKG:  EKG  ordered today showed NSR with IRBBB    Recent Labs: 07/04/2013: ALT 26    Lipid Panel    Component Value Date/Time   CHOL  04/02/2008 0610    105        ATP III CLASSIFICATION:  <200     mg/dL   Desirable  200-239  mg/dL   Borderline High  >=240    mg/dL   High          TRIG 138 01/02/2013 1028   TRIG 199* 04/02/2008 0610   HDL 22* 04/02/2008 0610   CHOLHDL 4.8 04/02/2008 0610   VLDL 40 04/02/2008 0610   LDLCALC 48 01/02/2013 1028   LDLCALC  04/02/2008 0610    43        Total Cholesterol/HDL:CHD Risk Coronary  Heart Disease Risk Table                     Men   Women  1/2 Average Risk   3.4   3.3  Average Risk       5.0   4.4  2 X Average Risk   9.6   7.1  3 X Average Risk  23.4   11.0        Use the calculated Patient Ratio above and the CHD Risk Table to determine the patient's CHD Risk.        ATP III CLASSIFICATION (LDL):  <100     mg/dL   Optimal  100-129  mg/dL   Near or Above                    Optimal  130-159  mg/dL   Borderline  160-189  mg/dL   High  >190     mg/dL   Very High      Wt Readings from Last 3 Encounters:  02/26/14 147 lb (66.679 kg)  12/25/12 145 lb (65.772 kg)  12/20/12 138 lb 3.2 oz (62.687 kg)       ASSESSMENT AND PLAN:  1.  ASCAD with no angina - continue ASA/Plavix 2.  Dyslipdiemia - continue Crestor - recheck NMR panel and ALT 3. Palpitation - resolved after cutting back on caffiene 4.  DM on Metformin and ACE I for renal protection - per PCP    Current medicines are reviewed at length with the patient today.  The patient does not have concerns regarding medicines.  The following changes have been made:  no change  Labs/ tests ordered today include: NMR and ALT    Disposition:   FU with me in 1 year   Signed, Sueanne Margarita, MD  02/26/2014 11:50 AM    [facility] [place], [place], [place]  [address] Phone: [phone]; Fax: [phone]

## 2014-02-26 NOTE — Patient Instructions (Signed)
Your physician recommends that you continue on your current medications as directed. Please refer to the Current Medication list given to you today.    Your physician wants you to follow-up in:  [place] will receive a reminder letter in the mail two months in advance. If you don't receive a letter, please call our office to schedule the follow-up appointment.    LABS TODAY NMR AND IN ONE WEEK LFT AND HEPATIC

## 2014-02-28 LAB — NMR LIPOPROFILE WITH LIPIDS
CHOLESTEROL, TOTAL: 111 mg/dL (ref 100–199)
HDL Particle Number: 28.9 umol/L — ABNORMAL LOW (ref >=30.5)
HDL Size: 8.5 nm — ABNORMAL LOW (ref >=9.2)
HDL-C: 31 mg/dL — AB (ref >39)
LDL (calc): 49 mg/dL (ref 0–99)
LDL Particle Number: 668 nmol/L (ref <1000)
LDL SIZE: 20.1 nm (ref >=20.8)
LP-IR Score: 72 — ABNORMAL HIGH (ref <=45)
Large HDL-P: <1.3 umol/L — ABNORMAL LOW (ref >=4.8)
Large VLDL-P: 3.4 nmol/L — ABNORMAL HIGH (ref <=2.7)
Small LDL Particle Number: 418 nmol/L (ref <=527)
Triglycerides: 155 mg/dL — ABNORMAL HIGH (ref 0–149)
VLDL SIZE: 49.5 nm — AB (ref <=46.6)

## 2014-03-01 ENCOUNTER — Other Ambulatory Visit: Payer: Self-pay | Admitting: Cardiology

## 2014-03-05 ENCOUNTER — Other Ambulatory Visit: Payer: Medicare Other

## 2014-03-29 ENCOUNTER — Other Ambulatory Visit: Payer: Self-pay | Admitting: Family Medicine

## 2014-03-29 DIAGNOSIS — Z136 Encounter for screening for cardiovascular disorders: Secondary | ICD-10-CM

## 2014-04-04 ENCOUNTER — Ambulatory Visit: Payer: Medicare Other

## 2014-08-08 ENCOUNTER — Other Ambulatory Visit: Payer: Self-pay | Admitting: Cardiology

## 2014-08-08 NOTE — Telephone Encounter (Signed)
Sueanne Margarita, MD at 02/26/2014 11:30 AM

## 2014-09-10 ENCOUNTER — Other Ambulatory Visit: Payer: Self-pay | Admitting: Cardiology

## 2015-03-20 ENCOUNTER — Other Ambulatory Visit: Payer: Self-pay | Admitting: *Deleted

## 2015-03-20 ENCOUNTER — Other Ambulatory Visit: Payer: Self-pay | Admitting: Cardiology

## 2015-03-20 MED ORDER — CLOPIDOGREL BISULFATE 75 MG PO TABS: 75.0000 mg | ORAL_TABLET | Freq: Every day | ORAL | Status: DC

## 2015-11-08 ENCOUNTER — Other Ambulatory Visit: Payer: Self-pay | Admitting: Cardiology

## 2015-12-17 ENCOUNTER — Other Ambulatory Visit: Payer: Self-pay | Admitting: Cardiology

## 2015-12-22 ENCOUNTER — Telehealth: Payer: Self-pay | Admitting: Cardiology

## 2015-12-22 ENCOUNTER — Ambulatory Visit (INDEPENDENT_AMBULATORY_CARE_PROVIDER_SITE_OTHER): Payer: Medicare Other | Admitting: Physician Assistant

## 2015-12-22 ENCOUNTER — Encounter: Payer: Self-pay | Admitting: Physician Assistant

## 2015-12-22 VITALS — BP 110/62 | HR 72 | Ht 68.0 in | Wt 144.8 lb

## 2015-12-22 DIAGNOSIS — I2510 Atherosclerotic heart disease of native coronary artery without angina pectoris: Secondary | ICD-10-CM | POA: Diagnosis not present

## 2015-12-22 DIAGNOSIS — R0789 Other chest pain: Secondary | ICD-10-CM

## 2015-12-22 DIAGNOSIS — R5383 Other fatigue: Secondary | ICD-10-CM

## 2015-12-22 DIAGNOSIS — E7800 Pure hypercholesterolemia, unspecified: Secondary | ICD-10-CM | POA: Diagnosis not present

## 2015-12-22 LAB — BASIC METABOLIC PANEL
BUN: 11 mg/dL (ref 7–25)
CALCIUM: 9.4 mg/dL (ref 8.6–10.3)
CO2: 26 mmol/L (ref 20–31)
CREATININE: 0.86 mg/dL (ref 0.70–1.18)
Chloride: 101 mmol/L (ref 98–110)
GLUCOSE: 129 mg/dL — AB (ref 65–99)
Potassium: 4.4 mmol/L (ref 3.5–5.3)
SODIUM: 137 mmol/L (ref 135–146)

## 2015-12-22 LAB — CBC
HCT: 43.3 % (ref 38.5–50.0)
HEMOGLOBIN: 14.7 g/dL (ref 13.2–17.1)
MCH: 30.9 pg (ref 27.0–33.0)
MCHC: 33.9 g/dL (ref 32.0–36.0)
MCV: 91.2 fL (ref 80.0–100.0)
MPV: 9.6 fL (ref 7.5–12.5)
PLATELETS: 311 10*3/uL (ref 140–400)
RBC: 4.75 MIL/uL (ref 4.20–5.80)
RDW: 12.7 % (ref 11.0–15.0)
WBC: 7 10*3/uL (ref 3.8–10.8)

## 2015-12-22 MED ORDER — CLOPIDOGREL BISULFATE 75 MG PO TABS: 75.0000 mg | ORAL_TABLET | Freq: Every day | ORAL | 3 refills | Status: DC

## 2015-12-22 NOTE — Telephone Encounter (Signed)
Follow up     Patient called this am to speak with on-call APP.   Patient was advise to come in today C/O weak spell and pain for about 36 hours.    Pt C/O of Chest Pain: STAT if CP now or developed within 24 hours  1. Are you having CP right now? [name] speaking - [name]:  0-10  Between  3-4 intermittent same pain he had before -   2. Are you experiencing any other symptoms (ex. SOB, nausea, vomiting, sweating)? Patient verbalized - no couple of days tiredness  3. How long have you been experiencing CP? Patient verbalized - since yesterday - no emergency room visit  4. Is your CP continuous or coming and going? Patient verbalized - coming and going   5. Have you taken Nitroglycerin? Patient verbalized - no  ?

## 2015-12-22 NOTE — Patient Instructions (Addendum)
Medication Instructions:  A REFILL FOR PLAVIX WAS SENT   Labwork: TODAY BMET, CBC, TSH  Testing/Procedures: Your physician has requested that you have en exercise stress myoview. For further information please visit [email or web address]. Please follow instruction sheet, as given.  Follow-Up: Your physician wants you to follow-up in: [place]. You will receive a reminder letter in the mail two months in advance. If you don't receive a letter, please call our office to schedule the follow-up appointment.  Any Other Special Instructions Will Be Listed Below (If Applicable).  If you need a refill on your cardiac medications before your next appointment, please call your pharmacy.

## 2015-12-22 NOTE — Telephone Encounter (Signed)
See my OV note from today. Richardson Dopp, PA-C   12/22/2015 5:31 PM

## 2015-12-22 NOTE — Telephone Encounter (Signed)
Patient states he spoke to [facility] last night and was told he needs to be seen today. He states his symptoms have not changed since speaking with Lurena Joiner. He reports his symptoms "are not bad enough to go to the Emergency Room but wants to get checked out." Scheduled patient to see Richardson Dopp, PA this afternoon. Patient understands to go to the ED prior to OV if symptoms worsen.

## 2015-12-22 NOTE — Telephone Encounter (Signed)
Pt called this am (7am). He had a couple of "weak spells" this weekend as well as some chest pain and wanted to be seen ASAP. I sent a message to the office to add the pt on today.  Kerin Ransom PA-C 12/22/2015 7:24 AM

## 2015-12-22 NOTE — Progress Notes (Signed)
Cardiology Office Note:    Date:  12/22/2015   ID:  [name], DOB September 12, 1940, MRN [id number]  PCP:  Lujean Amel, MD  Cardiologist:  Dr. Fransico Him   Electrophysiologist:  n/a  Referring MD: Lujean Amel, MD   Chief Complaint  Patient presents with  . Chest Pain    History of Present Illness:    [name] is a 75 y.o. male with a hx of CAD s/p DES to LCx in 2009 and subsequent DES to LCx in 2010, HL, DM.  Last seen by Dr. Fransico Him in 2/16.  He called in to the answering service this AM and noted weak spells and chest pain.  He was added on for evaluation.    He was in his USOH until the past few days.  He started to notice some L sided chest pain.  He points to his mid axillary line.  It is a sharp pain that lasts just seconds.  He denies assoc symptoms.  He denies exertional symptoms.  He denies dyspnea on exertion, orthopnea, PND, edema.   He denies syncope.  He has noted some fatigue.    Prior CV studies that were reviewed today include:    Myoview 7/12 diaph atten, o/w no ischemia or scar, EF 71; Low Risk  LHC 4/10 LM ok LAD patent LCx 99 prox to stent, stent is patent RCA patent PCI: DES to pLCx  Past Medical History:  Diagnosis Date  . CAD (coronary artery disease)    One vessel CAD status post PCI of left circumflex aqnd to the second obtuse marginal 2009 and PCI of left circ 03/2008 for stenosis above the prior stent  . Colon polyp 2006   repeat in 2 years  . COPD (chronic obstructive pulmonary disease) ([place])   . Diabetes mellitus without complication (HCC)    Diet-controlled non-insulin-dependent  . Dyslipidemia   . Elevated cholesterol   . Erectile dysfunction   . History of tobacco abuse 04/2007   COPD on CXR    Past Surgical History:  Procedure Laterality Date  . PERCUTANEOUS CORONARY STENT INTERVENTION (PCI-S)  05/2007   PCI of LCx 03/2008 for stenosis above prior stent    Current Medications: Current Meds  Medication Sig    . Ascorbic Acid (VITAMIN C) 100 MG tablet Take 100 mg by mouth every other day.  Marland Kitchen aspirin EC 81 MG tablet Take 81 mg by mouth daily.  . clopidogrel (PLAVIX) 75 MG tablet Take 1 tablet (75 mg total) by mouth daily.  . CRESTOR 20 MG tablet TAKE ONE TABLET EACH DAY  . lisinopril (PRINIVIL,ZESTRIL) 2.5 MG tablet Take 2.5 mg by mouth daily.  . metFORMIN (GLUCOPHAGE) 500 MG tablet Take 1,000 mg by mouth 2 (two) times daily with a meal.  . NITROSTAT 0.4 MG SL tablet TAKE AS DIRECTED  . sildenafil (VIAGRA) 50 MG tablet Take 50 mg by mouth daily as needed for erectile dysfunction.  . Vitamin D, Cholecalciferol, 1000 UNITS TABS Take 1 tablet by mouth daily.  . [DISCONTINUED] clopidogrel (PLAVIX) 75 MG tablet TAKE ONE TABLET EACH DAY     Allergies:   Sulfa antibiotics and Zocor [simvastatin]   Social History   Social History  . Marital status: Married    Spouse name: N/A  . Number of children: N/A  . Years of education: N/A   Social History Main Topics  . Smoking status: Former Smoker    Types: Cigarettes    Quit date: 12/20/2008  .  Smokeless tobacco: Never Used  . Alcohol use Yes     Comment: ocassional beer or glass of wine  . Drug use: No  . Sexual activity: Not Asked   Other Topics Concern  . None   Social History Narrative  . None     Family History:  The patient's family history includes Arthritis in his mother; Diabetes in his mother; Heart attack in his father.   ROS:   Please see the history of present illness.    Review of Systems  Constitution: Positive for malaise/fatigue.  Cardiovascular: Positive for chest pain and irregular heartbeat.  Hematologic/Lymphatic: Bruises/bleeds easily.   All other systems reviewed and are negative.   EKGs/Labs/Other Test Reviewed:    EKG:  EKG is  ordered today.  The ekg ordered today demonstrates NSR, HR 72, normal axis, RSR prime in V1-V2, QTc 422 ms, no significant change when compared to prior tracings  Recent Labs: No  results found for requested labs within last 8760 hours.   Recent Lipid Panel    Component Value Date/Time   CHOL 111 02/26/2014 1220   TRIG 155 (H) 02/26/2014 1220   HDL 31 (L) 02/26/2014 1220   CHOLHDL 4.8 04/02/2008 0610   VLDL 40 04/02/2008 0610   LDLCALC 49 02/26/2014 1220    Physical Exam:    VS:  BP 110/62   Pulse 72   Ht 5\' 8"  (1.727 m)   Wt 144 lb 12.8 oz (65.7 kg)   BMI 22.02 kg/m     Wt Readings from Last 3 Encounters:  12/22/15 144 lb 12.8 oz (65.7 kg)  02/26/14 147 lb (66.7 kg)  12/25/12 145 lb (65.8 kg)     Physical Exam  Constitutional: He is oriented to person, place, and time. He appears well-developed and well-nourished. No distress.  HENT:  Head: Normocephalic and atraumatic.  Eyes: No scleral icterus.  Neck: No JVD present.  Cardiovascular: Normal rate, regular rhythm and normal heart sounds.   No murmur heard. Pulmonary/Chest: Effort normal. He has no wheezes. He has no rales.  Abdominal: Soft. There is no tenderness. There is no rebound.  Musculoskeletal: He exhibits no edema.  Neurological: He is alert and oriented to person, place, and time.  Skin: Skin is warm and dry.  Psychiatric: He has a normal mood and affect.    ASSESSMENT:    1. Other chest pain   2. Coronary artery disease involving native coronary artery of native heart without angina pectoris   3. Pure hypercholesterolemia   4. Other fatigue    PLAN:    In order of problems listed above:  1. Chest pain - He has mainly atypical symptoms.   He notes some fatigue as well.  He denies any recurrent anginal symptoms.  However, he did have fairly atypical symptoms prior to his PCI in the past.  His symptoms mainly seem to be MSK in nature.  His ECG is unchanged.  He has not had a stress test in 5 years.   -  Obtain ETT-Myoview  2. CAD - s/p PCI with DES to the LCx in 2009 and again to the LCx in 2010.  Last myoview in 2012 was low risk.  Proceed with ETT-Myoview as noted.  Continue  ASA, Plavix, statin.  FU 6 mos with Dr. Fransico Him or sooner if stress test is abnormal or symptoms progress.  3. HL - LDL in 3/17 was 49.   Continue statin.    4. Fatigue - May be  related to DM.  Last A1c was 8.1 in 3/17.  Will get TSH, BMET, CBC.  Obtain Myoview. Otherwise, follow up with PCP.   Medication Adjustments/Labs and Tests Ordered: Current medicines are reviewed at length with the patient today.  Concerns regarding medicines are outlined above.  Medication changes, Labs and Tests ordered today are outlined in the Patient Instructions noted below. Patient Instructions  Medication Instructions:  A REFILL FOR PLAVIX WAS SENT   Labwork: TODAY BMET, CBC, TSH  Testing/Procedures: Your physician has requested that you have en exercise stress myoview. For further information please visit [email or web address]. Please follow instruction sheet, as given.  Follow-Up: Your physician wants you to follow-up in: [place]. You will receive a reminder letter in the mail two months in advance. If you don't receive a letter, please call our office to schedule the follow-up appointment.  Any Other Special Instructions Will Be Listed Below (If Applicable).  If you need a refill on your cardiac medications before your next appointment, please call your pharmacy.   Signed, Richardson Dopp, PA-C  12/22/2015 3:51 PM    [facility] [place], [place], [place]  [address] Phone: [phone]; Fax: [phone]

## 2015-12-23 ENCOUNTER — Telehealth: Payer: Self-pay | Admitting: *Deleted

## 2015-12-23 ENCOUNTER — Telehealth (HOSPITAL_COMMUNITY): Payer: Self-pay | Admitting: *Deleted

## 2015-12-23 LAB — TSH: TSH: 2.19 m[IU]/L (ref 0.40–4.50)

## 2015-12-23 NOTE — Telephone Encounter (Signed)
Patient given detailed instructions per Myocardial Perfusion Study Information Sheet for the test on 12/25/15. Patient notified to arrive 15 minutes early and that it is imperative to arrive on time for appointment to keep from having the test rescheduled.  If you need to cancel or reschedule your appointment, please call the office within 24 hours of your appointment. Failure to do so may result in a cancellation of your appointment, and a $50 no show fee. Patient verbalized understanding. Kirstie Peri

## 2015-12-23 NOTE — Telephone Encounter (Signed)
Pt notified of lab results by phone with verbal understanding.  

## 2015-12-25 ENCOUNTER — Ambulatory Visit (HOSPITAL_COMMUNITY): Payer: Medicare Other | Attending: Internal Medicine

## 2015-12-25 DIAGNOSIS — I2510 Atherosclerotic heart disease of native coronary artery without angina pectoris: Secondary | ICD-10-CM

## 2015-12-25 LAB — MYOCARDIAL PERFUSION IMAGING
CHL CUP NUCLEAR SDS: 4
CHL CUP NUCLEAR SSS: 10
CHL CUP RESTING HR STRESS: 71 {beats}/min
CSEPED: 7 min
CSEPEDS: 20 s
CSEPEW: 9 METS
CSEPPHR: 131 {beats}/min
LV dias vol: 75 mL (ref 62–150)
LVSYSVOL: 32 mL
MPHR: 146 {beats}/min
Percent HR: 90 %
RATE: 0.29
RPE: 18
SRS: 7
TID: 0.85

## 2015-12-25 MED ORDER — TECHNETIUM TC 99M TETROFOSMIN IV KIT
10.3000 | PACK | Freq: Once | INTRAVENOUS | Status: AC | PRN
  Administered 2015-12-25: 10.3 via INTRAVENOUS
  Filled 2015-12-25: qty 11

## 2015-12-25 MED ORDER — TECHNETIUM TC 99M TETROFOSMIN IV KIT
33.0000 | PACK | Freq: Once | INTRAVENOUS | Status: AC | PRN
  Administered 2015-12-25: 33 via INTRAVENOUS
  Filled 2015-12-25: qty 33

## 2015-12-26 ENCOUNTER — Telehealth: Payer: Self-pay | Admitting: *Deleted

## 2015-12-26 ENCOUNTER — Encounter: Payer: Self-pay | Admitting: Physician Assistant

## 2015-12-26 NOTE — Telephone Encounter (Signed)
Pt notified of normal Myoview results with normal blood flow as well. Pt asked is there a protocol to set up a Myoview like for example every 2 years for people who have stents. I explained to the pt there is not really a protocol and that this is based on dr evaluation and how the pt is feeling as well.

## 2016-01-01 ENCOUNTER — Encounter (HOSPITAL_COMMUNITY): Payer: Medicare Other

## 2016-04-15 ENCOUNTER — Other Ambulatory Visit: Payer: Self-pay | Admitting: Family Medicine

## 2016-04-15 DIAGNOSIS — Z87891 Personal history of nicotine dependence: Secondary | ICD-10-CM

## 2016-04-23 ENCOUNTER — Ambulatory Visit
Admission: RE | Admit: 2016-04-23 | Discharge: 2016-04-23 | Disposition: A | Discharge status: Home / Self Care | Payer: Medicare Other | Source: Ambulatory Visit | Attending: Family Medicine | Admitting: Family Medicine

## 2016-04-23 DIAGNOSIS — Z87891 Personal history of nicotine dependence: Secondary | ICD-10-CM

## 2016-05-26 ENCOUNTER — Other Ambulatory Visit (HOSPITAL_COMMUNITY): Payer: Self-pay | Admitting: Urology

## 2016-05-26 DIAGNOSIS — C61 Malignant neoplasm of prostate: Secondary | ICD-10-CM

## 2016-05-28 ENCOUNTER — Encounter (HOSPITAL_COMMUNITY)
Admission: RE | Admit: 2016-05-28 | Discharge: 2016-05-28 | Disposition: A | Discharge status: Home / Self Care | Payer: Medicare Other | Source: Ambulatory Visit | Attending: Urology | Admitting: Urology

## 2016-05-28 DIAGNOSIS — C61 Malignant neoplasm of prostate: Secondary | ICD-10-CM | POA: Diagnosis present

## 2016-05-28 MED ORDER — TECHNETIUM TC 99M MEDRONATE IV KIT
22.0000 | PACK | Freq: Once | INTRAVENOUS | Status: AC | PRN
  Administered 2016-05-28: 22 via INTRAVENOUS

## 2016-06-03 ENCOUNTER — Encounter: Payer: Self-pay | Admitting: Radiation Oncology

## 2016-06-04 ENCOUNTER — Encounter: Payer: Self-pay | Admitting: Radiation Oncology

## 2016-06-04 NOTE — Progress Notes (Signed)
GU Location of Tumor / Histology: prostatic adenocarcinoma  If Prostate Cancer, Gleason Score is (4 + 4) and PSA is (29.5). Prostate volume: 93 grams.  [name] had a negative prostate biopsy less than three years ago but, was lost to follow up. Patient's PSA was found to be elevated during his annual physical this year so his PCP immediately referred him to Dr. Risa Grill. Patient reports he missed his physical last year.   Biopsies of prostate (if applicable) revealed:    Past/Anticipated interventions by urology, if any: biopsy, CT and MRI (both negative), referral to radiation oncology, recommending adt with xrt. Patient reports he has not received ADT nor does he have a follow up appointment with Dr. Risa Grill  Past/Anticipated interventions by medical oncology, if any: no  Weight changes, if any: denies  Bowel/Bladder complaints, if any: IPSS 10. Denies dysuria, hematuria, leakage or incontinence.   Nausea/Vomiting, if any: denies  Pain issues, if any:  Sore throat after mowing grass yesterday  SAFETY ISSUES:  Prior radiation? denies  Pacemaker/ICD? denies  Possible current pregnancy? no  Is the patient on methotrexate? denies  Current Complaints / other details:  76 year old male. Everyday smoker. Married.

## 2016-06-07 ENCOUNTER — Ambulatory Visit
Admission: RE | Admit: 2016-06-07 | Discharge: 2016-06-07 | Disposition: A | Discharge status: Home / Self Care | Payer: Medicare Other | Source: Ambulatory Visit | Attending: Radiation Oncology | Admitting: Radiation Oncology

## 2016-06-07 ENCOUNTER — Ambulatory Visit: Payer: Medicare Other

## 2016-06-07 DIAGNOSIS — Z7982 Long term (current) use of aspirin: Secondary | ICD-10-CM | POA: Insufficient documentation

## 2016-06-07 DIAGNOSIS — Z510 Encounter for antineoplastic radiation therapy: Secondary | ICD-10-CM | POA: Insufficient documentation

## 2016-06-07 DIAGNOSIS — C61 Malignant neoplasm of prostate: Secondary | ICD-10-CM | POA: Insufficient documentation

## 2016-06-07 DIAGNOSIS — Z888 Allergy status to other drugs, medicaments and biological substances status: Secondary | ICD-10-CM | POA: Insufficient documentation

## 2016-06-07 DIAGNOSIS — Z833 Family history of diabetes mellitus: Secondary | ICD-10-CM | POA: Insufficient documentation

## 2016-06-07 DIAGNOSIS — Z955 Presence of coronary angioplasty implant and graft: Secondary | ICD-10-CM | POA: Insufficient documentation

## 2016-06-07 DIAGNOSIS — J449 Chronic obstructive pulmonary disease, unspecified: Secondary | ICD-10-CM | POA: Insufficient documentation

## 2016-06-07 DIAGNOSIS — E785 Hyperlipidemia, unspecified: Secondary | ICD-10-CM | POA: Insufficient documentation

## 2016-06-07 DIAGNOSIS — Z882 Allergy status to sulfonamides status: Secondary | ICD-10-CM | POA: Insufficient documentation

## 2016-06-07 DIAGNOSIS — Z79899 Other long term (current) drug therapy: Secondary | ICD-10-CM | POA: Insufficient documentation

## 2016-06-07 DIAGNOSIS — I2510 Atherosclerotic heart disease of native coronary artery without angina pectoris: Secondary | ICD-10-CM | POA: Insufficient documentation

## 2016-06-07 DIAGNOSIS — Z808 Family history of malignant neoplasm of other organs or systems: Secondary | ICD-10-CM | POA: Insufficient documentation

## 2016-06-07 DIAGNOSIS — Z87891 Personal history of nicotine dependence: Secondary | ICD-10-CM | POA: Insufficient documentation

## 2016-06-07 DIAGNOSIS — Z8249 Family history of ischemic heart disease and other diseases of the circulatory system: Secondary | ICD-10-CM | POA: Insufficient documentation

## 2016-06-07 DIAGNOSIS — E119 Type 2 diabetes mellitus without complications: Secondary | ICD-10-CM | POA: Insufficient documentation

## 2016-06-07 DIAGNOSIS — N529 Male erectile dysfunction, unspecified: Secondary | ICD-10-CM | POA: Insufficient documentation

## 2016-06-07 DIAGNOSIS — Z7984 Long term (current) use of oral hypoglycemic drugs: Secondary | ICD-10-CM | POA: Insufficient documentation

## 2016-06-07 DIAGNOSIS — Z86010 Personal history of colonic polyps: Secondary | ICD-10-CM | POA: Insufficient documentation

## 2016-06-07 DIAGNOSIS — Z7902 Long term (current) use of antithrombotics/antiplatelets: Secondary | ICD-10-CM | POA: Insufficient documentation

## 2016-06-07 HISTORY — DX: Malignant neoplasm of prostate: C61

## 2016-06-09 ENCOUNTER — Encounter: Payer: Self-pay | Admitting: Radiation Oncology

## 2016-06-09 ENCOUNTER — Ambulatory Visit
Admission: RE | Admit: 2016-06-09 | Discharge: 2016-06-09 | Disposition: A | Discharge status: Home / Self Care | Payer: Medicare Other | Source: Ambulatory Visit | Attending: Radiation Oncology | Admitting: Radiation Oncology

## 2016-06-09 VITALS — BP 120/69 | HR 68 | Temp 98.5°F | Resp 16 | Ht 68.0 in | Wt 141.4 lb

## 2016-06-09 DIAGNOSIS — Z955 Presence of coronary angioplasty implant and graft: Secondary | ICD-10-CM | POA: Diagnosis not present

## 2016-06-09 DIAGNOSIS — Z7902 Long term (current) use of antithrombotics/antiplatelets: Secondary | ICD-10-CM | POA: Diagnosis not present

## 2016-06-09 DIAGNOSIS — J449 Chronic obstructive pulmonary disease, unspecified: Secondary | ICD-10-CM | POA: Diagnosis not present

## 2016-06-09 DIAGNOSIS — E785 Hyperlipidemia, unspecified: Secondary | ICD-10-CM | POA: Diagnosis not present

## 2016-06-09 DIAGNOSIS — Z87891 Personal history of nicotine dependence: Secondary | ICD-10-CM | POA: Diagnosis not present

## 2016-06-09 DIAGNOSIS — I2510 Atherosclerotic heart disease of native coronary artery without angina pectoris: Secondary | ICD-10-CM | POA: Diagnosis not present

## 2016-06-09 DIAGNOSIS — Z8249 Family history of ischemic heart disease and other diseases of the circulatory system: Secondary | ICD-10-CM | POA: Diagnosis not present

## 2016-06-09 DIAGNOSIS — Z7982 Long term (current) use of aspirin: Secondary | ICD-10-CM | POA: Diagnosis not present

## 2016-06-09 DIAGNOSIS — Z86010 Personal history of colonic polyps: Secondary | ICD-10-CM | POA: Diagnosis not present

## 2016-06-09 DIAGNOSIS — Z888 Allergy status to other drugs, medicaments and biological substances status: Secondary | ICD-10-CM | POA: Diagnosis not present

## 2016-06-09 DIAGNOSIS — Z7984 Long term (current) use of oral hypoglycemic drugs: Secondary | ICD-10-CM | POA: Diagnosis not present

## 2016-06-09 DIAGNOSIS — Z510 Encounter for antineoplastic radiation therapy: Secondary | ICD-10-CM | POA: Diagnosis present

## 2016-06-09 DIAGNOSIS — Z808 Family history of malignant neoplasm of other organs or systems: Secondary | ICD-10-CM | POA: Diagnosis not present

## 2016-06-09 DIAGNOSIS — Z833 Family history of diabetes mellitus: Secondary | ICD-10-CM | POA: Diagnosis not present

## 2016-06-09 DIAGNOSIS — E119 Type 2 diabetes mellitus without complications: Secondary | ICD-10-CM | POA: Diagnosis not present

## 2016-06-09 DIAGNOSIS — N529 Male erectile dysfunction, unspecified: Secondary | ICD-10-CM | POA: Diagnosis not present

## 2016-06-09 DIAGNOSIS — Z79899 Other long term (current) drug therapy: Secondary | ICD-10-CM | POA: Diagnosis not present

## 2016-06-09 DIAGNOSIS — Z882 Allergy status to sulfonamides status: Secondary | ICD-10-CM | POA: Diagnosis not present

## 2016-06-09 DIAGNOSIS — C61 Malignant neoplasm of prostate: Secondary | ICD-10-CM

## 2016-06-09 NOTE — Progress Notes (Signed)
See progress note under physician encounter. 

## 2016-06-09 NOTE — Progress Notes (Signed)
Radiation Oncology         [phone] ________________________________  Initial Outpatient Consultation  Name: [name] MRN: [id number]  Date: 06/09/2016  DOB: November 18, 1940  WV:[id number], Dibas, MD  Rana Snare, MD   REFERRING PHYSICIAN: Rana Snare, MD  DIAGNOSIS: 76 year old man with stage T2a prostate cancer with Gleason's score of 4+4 and PSA of 29.5    ICD-9-CM ICD-10-CM   1. Malignant neoplasm of prostate ([place]) 185 C61     HISTORY OF PRESENT ILLNESS: [name] is a 76 y.o. male with a diagnosis of prostate cancer. He was noted to have an elevated PSA of 29.5 by his primary care physician during his annual physical exam this year. The patient reportedly missed his annual physical in 2017.  Accordingly, he was referred for evaluation in urology by Dr. Risa Grill on 05/04/14,  digital rectal examination was performed at that time revealing an enlarged, asymmetrical prostate with induration of the right lobe without obvious extension outside of the prostate.  The patient proceeded to transrectal ultrasound with 12 biopsies of the prostate on 05/10/16.  The prostate volume measured 93 cc.  Out of 12 core biopsies, 4 were positive.  The maximum Gleason score was 4+4, and this was seen in the right base, right base lateral, and right mid lateral.  The patient previously had a negative prostate biopsy in 05/2015 with a PSA of 8-9 at that time, but was lost to follow up thereafter.  Recent Bone Scan  And CT A&P on 05/28/16 showed no evidence of metastatic disease.   The patient reviewed the biopsy results with his urologist and he has kindly been referred today for discussion of potential radiation treatment options. The patient is accompanied by a friend today.      PREVIOUS RADIATION THERAPY: No  PAST MEDICAL HISTORY:  Past Medical History:  Diagnosis Date  . CAD (coronary artery disease)    One vessel CAD status post PCI of left circumflex aqnd to the second obtuse  marginal 2009 and PCI of left circ 03/2008 for stenosis above the prior stent // Myoview 11/17: EF 57, no scar or ischemia, excellent exercise tolerance; Low Risk  . Colon polyp 2006   repeat in 2 years  . COPD (chronic obstructive pulmonary disease) ([place])   . Diabetes mellitus without complication (HCC)    Diet-controlled non-insulin-dependent  . Dyslipidemia   . Elevated cholesterol   . Erectile dysfunction   . History of tobacco abuse 04/2007   COPD on CXR  . Prostate cancer ([place])       PAST SURGICAL HISTORY: Past Surgical History:  Procedure Laterality Date  . PERCUTANEOUS CORONARY STENT INTERVENTION (PCI-S)  05/2007   PCI of LCx 03/2008 for stenosis above prior stent  . PROSTATE BIOPSY      FAMILY HISTORY:  Family History  Problem Relation Age of Onset  . Diabetes Mother   . Arthritis Mother   . Heart attack Father   . Cancer Sister        melanoma    SOCIAL HISTORY:  Social History   Social History  . Marital status: Single    Spouse name: N/A  . Number of children: N/A  . Years of education: N/A   Occupational History  . Not on file.   Social History Main Topics  . Smoking status: Former Smoker    Packs/day: 0.25    Years: 40.00    Types: Cigarettes    Quit date: 12/21/2003  . Smokeless tobacco:  Never Used  . Alcohol use Yes     Comment: ocassional beer or glass of wine  . Drug use: No  . Sexual activity: Yes   Other Topics Concern  . Not on file   Social History Narrative  . No narrative on file    ALLERGIES: Sulfa antibiotics and Zocor [simvastatin]  MEDICATIONS:  Current Outpatient Prescriptions  Medication Sig Dispense Refill  . aspirin EC 81 MG tablet Take 81 mg by mouth daily.    . clopidogrel (PLAVIX) 75 MG tablet Take 1 tablet (75 mg total) by mouth daily. 90 tablet 3  . CRESTOR 20 MG tablet TAKE ONE TABLET EACH DAY 30 tablet 11  . lisinopril (PRINIVIL,ZESTRIL) 2.5 MG tablet Take 2.5 mg by mouth daily. Patient doesn't take this to  manage BP. He takes this to prevent protein spillage.    . metFORMIN (GLUCOPHAGE) 500 MG tablet Take 1,000 mg by mouth 2 (two) times daily with a meal.    . sildenafil (VIAGRA) 50 MG tablet Take 50 mg by mouth daily as needed for erectile dysfunction.    . Vitamin D, Cholecalciferol, 1000 UNITS TABS Take 1 tablet by mouth daily.    . Ascorbic Acid (VITAMIN C) 100 MG tablet Take 100 mg by mouth every other day.    Marland Kitchen NITROSTAT 0.4 MG SL tablet TAKE AS DIRECTED (Patient not taking: Reported on 06/09/2016) 25 tablet 11   No current facility-administered medications for this encounter.     REVIEW OF SYSTEMS:  On review of systems, the patient reports that he is doing well overall. He denies any chest pain, shortness of breath, cough, fevers, chills, night sweats, unintended weight changes. He denies any bowel disturbances, and denies abdominal pain, nausea or vomiting. He reports a sore throat after mowing the grass yesterday, and associates this with seasonal allergies. He denies any new musculoskeletal or joint aches or pains. His IPSS was 10, indicating moderate urinary symptoms. He denies dysuria, hematuria, leakage, or urinary incontinence. He is able to complete sexual activity with most attempts. A complete review of systems is obtained and is otherwise negative.   PHYSICAL EXAM:  Wt Readings from Last 3 Encounters:  06/09/16 141 lb 6.4 oz (64.1 kg)  12/25/15 144 lb (65.3 kg)  12/22/15 144 lb 12.8 oz (65.7 kg)   Temp Readings from Last 3 Encounters:  06/09/16 98.5 F (36.9 C) (Oral)   BP Readings from Last 3 Encounters:  06/09/16 120/69  12/22/15 110/62  02/26/14 100/72   Pulse Readings from Last 3 Encounters:  06/09/16 68  12/22/15 72  02/26/14 62   In general this is a well appearing Caucasian man in no acute distress. He is alert and oriented x4 and appropriate throughout the examination. HEENT reveals that the patient is normocephalic, atraumatic. Skin is intact without any  evidence of gross lesions. Cardiovascular exam reveals a regular rate and rhythm, no clicks rubs or murmurs are auscultated. Chest is clear to auscultation bilaterally. Lymphatic assessment is performed and does not reveal any adenopathy in the cervical, supraclavicular, axillary, or inguinal chains. Abdomen has active bowel sounds in all quadrants and is intact. The abdomen is soft, non tender, non distended. Lower extremities are negative for pretibial pitting edema.  KPS = 100  100 - Normal; no complaints; no evidence of disease. 90   - Able to carry on normal activity; minor signs or symptoms of disease. 80   - Normal activity with effort; some signs or symptoms of disease. 70   -  Cares for self; unable to carry on normal activity or to do active work. 60   - Requires occasional assistance, but is able to care for most of his personal needs. 50   - Requires considerable assistance and frequent medical care. 47   - Disabled; requires special care and assistance. 37   - Severely disabled; hospital admission is indicated although death not imminent. 38   - Very sick; hospital admission necessary; active supportive treatment necessary. 10   - Moribund; fatal processes progressing rapidly. 0     - Dead  Karnofsky DA, Abelmann [place], Craver LS and Burchenal [facility] [phone]) The use of the nitrogen mustards in the palliative treatment of carcinoma: with particular reference to bronchogenic carcinoma Cancer 1 634-56  LABORATORY DATA:  Lab Results  Component Value Date   WBC 7.0 12/22/2015   HGB 14.7 12/22/2015   HCT 43.3 12/22/2015   MCV 91.2 12/22/2015   PLT 311 12/22/2015   Lab Results  Component Value Date   NA 137 12/22/2015   K 4.4 12/22/2015   CL 101 12/22/2015   CO2 26 12/22/2015   Lab Results  Component Value Date   ALT 26 07/04/2013   AST 21 07/04/2013   ALKPHOS 36 (L) 07/04/2013   BILITOT 1.0 07/04/2013     RADIOGRAPHY: Nm Bone Scan Whole Body  Result Date: 05/28/2016 CLINICAL  DATA:  Prostate cancer. EXAM: NUCLEAR MEDICINE WHOLE BODY BONE SCAN TECHNIQUE: Whole body anterior and posterior images were obtained approximately 3 hours after intravenous injection of radiopharmaceutical. RADIOPHARMACEUTICALS:  22.0 mCi Technetium-53m MDP IV COMPARISON:  CT 05/28/2016. FINDINGS: Bilateral renal function and excretion. No focal bony abnormalities identified. No evidence of bony metastatic disease. IMPRESSION: Normal exam. Electronically Signed   By: Marcello Moores  Register   On: 05/28/2016 13:05      IMPRESSION/PLAN: 1. 76 y.o. gentleman with Stage T2a adenocarcinoma of the prostate with Gleason Score of 4+4, and PSA of 29.5.  Today we and I reviewed the findings and workup thus far.  We discussed the natural history of high risk prostate cancer.  We reviewed the the implications of T-stage, Gleason's Score, and PSA on decision-making and outcomes in prostate cancer.  We discussed radiation treatment in the management of prostate cancer with regard to the logistics and delivery of external beam radiation treatment as well as the logistics and delivery of EBRT with prostate brachytherapy boost.  We compared and contrasted each of these approaches and also compared these against prostatectomy.  Due to his prostate volume of 93 cc. he would require significant downsizing of his gland with an alpha reductase inhibitor and ADT prior to brachytherapy.  He is not particularly interested in brachytherapy.  We discussed the role of androgen deprivation therapy in the treatment of high risk prostate cancer.  We would recommend a 2 year course of ADT, beginning 2 months prior to the start of EBRT.  The patient expressed interest in external beam radiation therapy to the prostate, coupled with hormone therapy.   The patient would like to proceed with the recommended 8 week course of external beam radiation therapy concurrent with LT-ADT.  We will share our findings with Dr. Risa Grill and move forward with  scheduling hormone injections and gold seed marker placement in the near future.  We enjoyed meeting with him today, and will look forward to participating in the care of this very nice gentleman.    Nicholos Johns, PA-C    Tyler Pita, MD  [place]  Radiation Oncology Direct Dial: [phone]  Fax: [phone] [email or web address]  Skype  LinkedIn  This document serves as a record of services personally performed by Tyler Pita, MD and Freeman Caldron, PA-C. It was created on their behalf by Maryla Morrow, a trained medical scribe. The creation of this record is based on the scribe's personal observations and the provider's statements to them. This document has been checked and approved by the attending provider.

## 2016-06-17 ENCOUNTER — Telehealth: Payer: Self-pay | Admitting: *Deleted

## 2016-06-17 NOTE — Telephone Encounter (Signed)
RECEIVED A PHONE CALL FROM THIS PATIENT ASKING ABOUT HIS ADT AND GOLD SEED PLACEMENT, I EXPLAINED TO THIS PT. THAT I HAVE MADE SEVERAL CALLS AND AM WAITING FOR A REBUTTAL FROM DR. GRAPEY'S NURSE, PT. VERIFIED UNDERSTANDING THIS, I TOLD HIM HOPEFULLY IT WOULD BE SCHEDULED BY 06-23-16

## 2016-06-17 NOTE — Telephone Encounter (Signed)
xxxx 

## 2016-06-24 ENCOUNTER — Telehealth: Payer: Self-pay | Admitting: *Deleted

## 2016-06-24 NOTE — Telephone Encounter (Signed)
Called patient to inform of sim appt. On 08-20-16 @ 11 am @ Dr. Johny Shears Office, lvm for a return call

## 2016-07-02 ENCOUNTER — Encounter: Payer: Self-pay | Admitting: Urology

## 2016-07-02 NOTE — Progress Notes (Addendum)
Patient started ADT and had gold seed markers placed 06/30/16 with Dr. Risa Grill.  Scheduled for CT [facility] for prostate IMRT on 08/20/16.

## 2016-07-15 ENCOUNTER — Encounter: Payer: Self-pay | Admitting: Medical Oncology

## 2016-07-15 NOTE — Progress Notes (Signed)
Introduced myself to Mr. [name] as the prostate nurse navigator and my role. He states that he received his gold markers and Lupron 06/30/16. He has had a few hot flashes but nothing major. We discussed the CT simulation and what to expect. All questions were answered and I asked him to call me with questions or concerns. He voiced understanding.

## 2016-08-15 NOTE — Progress Notes (Signed)
  Radiation Oncology         (336) [phone] ________________________________  Name: [name] MRN: [id number]  Date: 08/20/2016  DOB: [birth year]  SIMULATION AND TREATMENT PLANNING NOTE    ICD-10-CM   1. Malignant neoplasm of prostate ([place]) C61     DIAGNOSIS:  76 year old man with stage T2a prostate cancer with Gleason's score of 4+4 and PSA of 29.5  NARRATIVE:  The patient was brought to the [place].  Identity was confirmed.  All relevant records and images related to the planned course of therapy were reviewed.  The patient freely provided informed written consent to proceed with treatment after reviewing the details related to the planned course of therapy. The consent form was witnessed and verified by the simulation staff.  Then, the patient was set-up in a stable reproducible supine position for radiation therapy.  A vacuum lock pillow device was custom fabricated to position his legs in a reproducible immobilized position.  Then, I performed a urethrogram under sterile conditions to identify the prostatic apex.  CT images were obtained.  Surface markings were placed.  The CT images were loaded into the planning software.  Then the prostate target and avoidance structures including the rectum, bladder, bowel and hips were contoured.  Treatment planning then occurred.  The radiation prescription was entered and confirmed.  A total of one complex treatment devices were fabricated. I have requested : Intensity Modulated Radiotherapy (IMRT) is medically necessary for this case for the following reason:  Rectal sparing.Marland Kitchen  PLAN:  The patient will receive 45 Gy in 25 fractions of 1.8 Gy, followed by a boost to the prostate to a total dose of 75 Gy with 15 additional fractions of 2.0 Gy.  ________________________________  Sheral Apley Tammi Klippel, M.D.

## 2016-08-20 ENCOUNTER — Encounter: Payer: Self-pay | Admitting: Medical Oncology

## 2016-08-20 ENCOUNTER — Ambulatory Visit
Admission: RE | Admit: 2016-08-20 | Discharge: 2016-08-20 | Disposition: A | Discharge status: Home / Self Care | Payer: Medicare Other | Source: Ambulatory Visit | Attending: Radiation Oncology | Admitting: Radiation Oncology

## 2016-08-20 DIAGNOSIS — C61 Malignant neoplasm of prostate: Secondary | ICD-10-CM

## 2016-08-20 DIAGNOSIS — Z510 Encounter for antineoplastic radiation therapy: Secondary | ICD-10-CM | POA: Diagnosis not present

## 2016-08-30 DIAGNOSIS — Z510 Encounter for antineoplastic radiation therapy: Secondary | ICD-10-CM | POA: Diagnosis not present

## 2016-08-31 ENCOUNTER — Encounter: Payer: Self-pay | Admitting: Medical Oncology

## 2016-08-31 ENCOUNTER — Ambulatory Visit
Admission: RE | Admit: 2016-08-31 | Discharge: 2016-08-31 | Disposition: A | Discharge status: Home / Self Care | Payer: Medicare Other | Source: Ambulatory Visit | Attending: Radiation Oncology | Admitting: Radiation Oncology

## 2016-08-31 DIAGNOSIS — Z510 Encounter for antineoplastic radiation therapy: Secondary | ICD-10-CM | POA: Diagnosis not present

## 2016-09-01 ENCOUNTER — Ambulatory Visit
Admission: RE | Admit: 2016-09-01 | Discharge: 2016-09-01 | Disposition: A | Discharge status: Home / Self Care | Payer: Medicare Other | Source: Ambulatory Visit | Attending: Radiation Oncology | Admitting: Radiation Oncology

## 2016-09-01 DIAGNOSIS — Z510 Encounter for antineoplastic radiation therapy: Secondary | ICD-10-CM | POA: Diagnosis not present

## 2016-09-02 ENCOUNTER — Ambulatory Visit
Admission: RE | Admit: 2016-09-02 | Discharge: 2016-09-02 | Disposition: A | Discharge status: Home / Self Care | Payer: Medicare Other | Source: Ambulatory Visit | Attending: Radiation Oncology | Admitting: Radiation Oncology

## 2016-09-02 DIAGNOSIS — Z510 Encounter for antineoplastic radiation therapy: Secondary | ICD-10-CM | POA: Diagnosis not present

## 2016-09-03 ENCOUNTER — Ambulatory Visit: Payer: Medicare Other

## 2016-09-06 ENCOUNTER — Ambulatory Visit
Admission: RE | Admit: 2016-09-06 | Discharge: 2016-09-06 | Disposition: A | Discharge status: Home / Self Care | Payer: Medicare Other | Source: Ambulatory Visit | Attending: Radiation Oncology | Admitting: Radiation Oncology

## 2016-09-06 ENCOUNTER — Other Ambulatory Visit: Payer: Self-pay | Admitting: Radiation Oncology

## 2016-09-06 DIAGNOSIS — Z510 Encounter for antineoplastic radiation therapy: Secondary | ICD-10-CM | POA: Diagnosis present

## 2016-09-06 DIAGNOSIS — C61 Malignant neoplasm of prostate: Secondary | ICD-10-CM | POA: Diagnosis not present

## 2016-09-06 MED ORDER — TAMSULOSIN HCL 0.4 MG PO CAPS: 0.4000 mg | ORAL_CAPSULE | Freq: Every day | ORAL | 6 refills | Status: AC

## 2016-09-07 ENCOUNTER — Encounter: Payer: Self-pay | Admitting: Medical Oncology

## 2016-09-07 ENCOUNTER — Ambulatory Visit
Admission: RE | Admit: 2016-09-07 | Discharge: 2016-09-07 | Disposition: A | Discharge status: Home / Self Care | Payer: Medicare Other | Source: Ambulatory Visit | Attending: Radiation Oncology | Admitting: Radiation Oncology

## 2016-09-07 ENCOUNTER — Telehealth: Payer: Self-pay | Admitting: Radiation Oncology

## 2016-09-07 DIAGNOSIS — Z510 Encounter for antineoplastic radiation therapy: Secondary | ICD-10-CM | POA: Diagnosis not present

## 2016-09-07 NOTE — Telephone Encounter (Signed)
Received EPIC message that Flomax transcription failed. Called in prescription to [facility] at [facility] [employer].

## 2016-09-07 NOTE — Telephone Encounter (Signed)
Phoned patient's home. No answer. Left message that script had been called to his preferred pharmacy. Left contact number for any questions.

## 2016-09-08 ENCOUNTER — Ambulatory Visit
Admission: RE | Admit: 2016-09-08 | Discharge: 2016-09-08 | Disposition: A | Discharge status: Home / Self Care | Payer: Medicare Other | Source: Ambulatory Visit | Attending: Radiation Oncology | Admitting: Radiation Oncology

## 2016-09-08 DIAGNOSIS — Z510 Encounter for antineoplastic radiation therapy: Secondary | ICD-10-CM | POA: Diagnosis not present

## 2016-09-09 ENCOUNTER — Ambulatory Visit
Admission: RE | Admit: 2016-09-09 | Discharge: 2016-09-09 | Disposition: A | Discharge status: Home / Self Care | Payer: Medicare Other | Source: Ambulatory Visit | Attending: Radiation Oncology | Admitting: Radiation Oncology

## 2016-09-09 DIAGNOSIS — Z510 Encounter for antineoplastic radiation therapy: Secondary | ICD-10-CM | POA: Diagnosis not present

## 2016-09-10 ENCOUNTER — Ambulatory Visit
Admission: RE | Admit: 2016-09-10 | Discharge: 2016-09-10 | Disposition: A | Discharge status: Home / Self Care | Payer: Medicare Other | Source: Ambulatory Visit | Attending: Radiation Oncology | Admitting: Radiation Oncology

## 2016-09-10 DIAGNOSIS — Z510 Encounter for antineoplastic radiation therapy: Secondary | ICD-10-CM | POA: Diagnosis not present

## 2016-09-13 ENCOUNTER — Ambulatory Visit
Admission: RE | Admit: 2016-09-13 | Discharge: 2016-09-13 | Disposition: A | Discharge status: Home / Self Care | Payer: Medicare Other | Source: Ambulatory Visit | Attending: Radiation Oncology | Admitting: Radiation Oncology

## 2016-09-13 DIAGNOSIS — Z510 Encounter for antineoplastic radiation therapy: Secondary | ICD-10-CM | POA: Diagnosis not present

## 2016-09-14 ENCOUNTER — Telehealth: Payer: Self-pay | Admitting: *Deleted

## 2016-09-14 ENCOUNTER — Ambulatory Visit
Admission: RE | Admit: 2016-09-14 | Discharge: 2016-09-14 | Disposition: A | Discharge status: Home / Self Care | Payer: Medicare Other | Source: Ambulatory Visit | Attending: Radiation Oncology | Admitting: Radiation Oncology

## 2016-09-14 DIAGNOSIS — Z510 Encounter for antineoplastic radiation therapy: Secondary | ICD-10-CM | POA: Diagnosis not present

## 2016-09-14 NOTE — Telephone Encounter (Signed)
CALLED PATIENT TO INFORM OF NUTRITION APPT. ON 10-01-16 @ 9 AM WITH BARBARA NEFF, SPOKE WITH PATIENT AND HE IS AWARE OF THIS APPT.

## 2016-09-15 ENCOUNTER — Ambulatory Visit
Admission: RE | Admit: 2016-09-15 | Discharge: 2016-09-15 | Disposition: A | Discharge status: Home / Self Care | Payer: Medicare Other | Source: Ambulatory Visit | Attending: Radiation Oncology | Admitting: Radiation Oncology

## 2016-09-15 DIAGNOSIS — Z510 Encounter for antineoplastic radiation therapy: Secondary | ICD-10-CM | POA: Diagnosis not present

## 2016-09-16 ENCOUNTER — Ambulatory Visit
Admission: RE | Admit: 2016-09-16 | Discharge: 2016-09-16 | Disposition: A | Discharge status: Home / Self Care | Payer: Medicare Other | Source: Ambulatory Visit | Attending: Radiation Oncology | Admitting: Radiation Oncology

## 2016-09-16 DIAGNOSIS — Z510 Encounter for antineoplastic radiation therapy: Secondary | ICD-10-CM | POA: Diagnosis not present

## 2016-09-17 ENCOUNTER — Ambulatory Visit
Admission: RE | Admit: 2016-09-17 | Discharge: 2016-09-17 | Disposition: A | Discharge status: Home / Self Care | Payer: Medicare Other | Source: Ambulatory Visit | Attending: Radiation Oncology | Admitting: Radiation Oncology

## 2016-09-17 DIAGNOSIS — Z510 Encounter for antineoplastic radiation therapy: Secondary | ICD-10-CM | POA: Diagnosis not present

## 2016-09-20 ENCOUNTER — Ambulatory Visit
Admission: RE | Admit: 2016-09-20 | Discharge: 2016-09-20 | Disposition: A | Discharge status: Home / Self Care | Payer: Medicare Other | Source: Ambulatory Visit | Attending: Radiation Oncology | Admitting: Radiation Oncology

## 2016-09-20 DIAGNOSIS — Z510 Encounter for antineoplastic radiation therapy: Secondary | ICD-10-CM | POA: Diagnosis not present

## 2016-09-21 ENCOUNTER — Ambulatory Visit
Admission: RE | Admit: 2016-09-21 | Discharge: 2016-09-21 | Disposition: A | Discharge status: Home / Self Care | Payer: Medicare Other | Source: Ambulatory Visit | Attending: Radiation Oncology | Admitting: Radiation Oncology

## 2016-09-21 DIAGNOSIS — Z510 Encounter for antineoplastic radiation therapy: Secondary | ICD-10-CM | POA: Diagnosis not present

## 2016-09-22 ENCOUNTER — Ambulatory Visit
Admission: RE | Admit: 2016-09-22 | Discharge: 2016-09-22 | Disposition: A | Discharge status: Home / Self Care | Payer: Medicare Other | Source: Ambulatory Visit | Attending: Radiation Oncology | Admitting: Radiation Oncology

## 2016-09-22 DIAGNOSIS — Z510 Encounter for antineoplastic radiation therapy: Secondary | ICD-10-CM | POA: Diagnosis not present

## 2016-09-23 ENCOUNTER — Ambulatory Visit
Admission: RE | Admit: 2016-09-23 | Discharge: 2016-09-23 | Disposition: A | Discharge status: Home / Self Care | Payer: Medicare Other | Source: Ambulatory Visit | Attending: Radiation Oncology | Admitting: Radiation Oncology

## 2016-09-23 DIAGNOSIS — Z510 Encounter for antineoplastic radiation therapy: Secondary | ICD-10-CM | POA: Diagnosis not present

## 2016-09-24 ENCOUNTER — Ambulatory Visit
Admission: RE | Admit: 2016-09-24 | Discharge: 2016-09-24 | Disposition: A | Discharge status: Home / Self Care | Payer: Medicare Other | Source: Ambulatory Visit | Attending: Radiation Oncology | Admitting: Radiation Oncology

## 2016-09-24 DIAGNOSIS — Z510 Encounter for antineoplastic radiation therapy: Secondary | ICD-10-CM | POA: Diagnosis not present

## 2016-09-27 DIAGNOSIS — Z510 Encounter for antineoplastic radiation therapy: Secondary | ICD-10-CM | POA: Diagnosis not present

## 2016-09-28 ENCOUNTER — Ambulatory Visit
Admission: RE | Admit: 2016-09-28 | Discharge: 2016-09-28 | Disposition: A | Discharge status: Home / Self Care | Payer: Medicare Other | Source: Ambulatory Visit | Attending: Radiation Oncology | Admitting: Radiation Oncology

## 2016-09-28 DIAGNOSIS — Z510 Encounter for antineoplastic radiation therapy: Secondary | ICD-10-CM | POA: Diagnosis not present

## 2016-09-29 ENCOUNTER — Ambulatory Visit
Admission: RE | Admit: 2016-09-29 | Discharge: 2016-09-29 | Disposition: A | Discharge status: Home / Self Care | Payer: Medicare Other | Source: Ambulatory Visit | Attending: Radiation Oncology | Admitting: Radiation Oncology

## 2016-09-29 DIAGNOSIS — Z510 Encounter for antineoplastic radiation therapy: Secondary | ICD-10-CM | POA: Diagnosis not present

## 2016-09-30 ENCOUNTER — Ambulatory Visit
Admission: RE | Admit: 2016-09-30 | Discharge: 2016-09-30 | Disposition: A | Discharge status: Home / Self Care | Payer: Medicare Other | Source: Ambulatory Visit | Attending: Radiation Oncology | Admitting: Radiation Oncology

## 2016-09-30 DIAGNOSIS — Z510 Encounter for antineoplastic radiation therapy: Secondary | ICD-10-CM | POA: Diagnosis not present

## 2016-10-01 ENCOUNTER — Ambulatory Visit
Admission: RE | Admit: 2016-10-01 | Discharge: 2016-10-01 | Disposition: A | Discharge status: Home / Self Care | Payer: Medicare Other | Source: Ambulatory Visit | Attending: Radiation Oncology | Admitting: Radiation Oncology

## 2016-10-01 ENCOUNTER — Ambulatory Visit: Payer: Medicare Other | Admitting: Nutrition

## 2016-10-01 DIAGNOSIS — Z510 Encounter for antineoplastic radiation therapy: Secondary | ICD-10-CM | POA: Diagnosis not present

## 2016-10-01 NOTE — Progress Notes (Signed)
Patient showed up an hour and 15 minutes late for his nutrition appointment.  He is a 76 year old male with prostate cancer receiving radiation. Reports he has developed diarrhea and he is concerned about dietary choices secondary to diabetes.  Nutrition diagnosis: Food and nutrition related knowledge deficit related to prostate cancer and associated treatments as evidenced by no prior need for nutrition related information.  Intervention: Patient was educated on carbohydrate controlled low residue diet.  He was given a fact sheet.  Questions were answered.  Teach back method used.  Contact information provided.  Monitoring, evaluation, goals: Patient will tolerate carbohydrate controlled low fiber diet.  Nutrition diagnosis resolved.

## 2016-10-04 ENCOUNTER — Ambulatory Visit
Admission: RE | Admit: 2016-10-04 | Discharge: 2016-10-04 | Disposition: A | Discharge status: Home / Self Care | Payer: Medicare Other | Source: Ambulatory Visit | Attending: Radiation Oncology | Admitting: Radiation Oncology

## 2016-10-04 DIAGNOSIS — Z510 Encounter for antineoplastic radiation therapy: Secondary | ICD-10-CM | POA: Diagnosis not present

## 2016-10-05 ENCOUNTER — Ambulatory Visit
Admission: RE | Admit: 2016-10-05 | Discharge: 2016-10-05 | Disposition: A | Discharge status: Home / Self Care | Payer: Medicare Other | Source: Ambulatory Visit | Attending: Radiation Oncology | Admitting: Radiation Oncology

## 2016-10-05 DIAGNOSIS — Z510 Encounter for antineoplastic radiation therapy: Secondary | ICD-10-CM | POA: Diagnosis not present

## 2016-10-06 ENCOUNTER — Ambulatory Visit
Admission: RE | Admit: 2016-10-06 | Discharge: 2016-10-06 | Disposition: A | Discharge status: Home / Self Care | Payer: Medicare Other | Source: Ambulatory Visit | Attending: Radiation Oncology | Admitting: Radiation Oncology

## 2016-10-06 DIAGNOSIS — Z510 Encounter for antineoplastic radiation therapy: Secondary | ICD-10-CM | POA: Diagnosis not present

## 2016-10-07 ENCOUNTER — Ambulatory Visit
Admission: RE | Admit: 2016-10-07 | Discharge: 2016-10-07 | Disposition: A | Discharge status: Home / Self Care | Payer: Medicare Other | Source: Ambulatory Visit | Attending: Radiation Oncology | Admitting: Radiation Oncology

## 2016-10-07 DIAGNOSIS — Z510 Encounter for antineoplastic radiation therapy: Secondary | ICD-10-CM | POA: Diagnosis not present

## 2016-10-08 ENCOUNTER — Ambulatory Visit
Admission: RE | Admit: 2016-10-08 | Discharge: 2016-10-08 | Disposition: A | Discharge status: Home / Self Care | Payer: Medicare Other | Source: Ambulatory Visit | Attending: Radiation Oncology | Admitting: Radiation Oncology

## 2016-10-08 DIAGNOSIS — Z510 Encounter for antineoplastic radiation therapy: Secondary | ICD-10-CM | POA: Diagnosis not present

## 2016-10-11 ENCOUNTER — Ambulatory Visit
Admission: RE | Admit: 2016-10-11 | Discharge: 2016-10-11 | Disposition: A | Discharge status: Home / Self Care | Payer: Medicare Other | Source: Ambulatory Visit | Attending: Radiation Oncology | Admitting: Radiation Oncology

## 2016-10-11 DIAGNOSIS — Z510 Encounter for antineoplastic radiation therapy: Secondary | ICD-10-CM | POA: Diagnosis not present

## 2016-10-12 ENCOUNTER — Ambulatory Visit
Admission: RE | Admit: 2016-10-12 | Discharge: 2016-10-12 | Disposition: A | Discharge status: Home / Self Care | Payer: Medicare Other | Source: Ambulatory Visit | Attending: Radiation Oncology | Admitting: Radiation Oncology

## 2016-10-12 DIAGNOSIS — Z510 Encounter for antineoplastic radiation therapy: Secondary | ICD-10-CM | POA: Diagnosis not present

## 2016-10-13 ENCOUNTER — Ambulatory Visit
Admission: RE | Admit: 2016-10-13 | Discharge: 2016-10-13 | Disposition: A | Discharge status: Home / Self Care | Payer: Medicare Other | Source: Ambulatory Visit | Attending: Radiation Oncology | Admitting: Radiation Oncology

## 2016-10-13 DIAGNOSIS — Z510 Encounter for antineoplastic radiation therapy: Secondary | ICD-10-CM | POA: Diagnosis not present

## 2016-10-14 ENCOUNTER — Ambulatory Visit
Admission: RE | Admit: 2016-10-14 | Discharge: 2016-10-14 | Disposition: A | Discharge status: Home / Self Care | Payer: Medicare Other | Source: Ambulatory Visit | Attending: Radiation Oncology | Admitting: Radiation Oncology

## 2016-10-14 DIAGNOSIS — Z510 Encounter for antineoplastic radiation therapy: Secondary | ICD-10-CM | POA: Diagnosis not present

## 2016-10-15 ENCOUNTER — Ambulatory Visit
Admission: RE | Admit: 2016-10-15 | Discharge: 2016-10-15 | Disposition: A | Discharge status: Home / Self Care | Payer: Medicare Other | Source: Ambulatory Visit | Attending: Radiation Oncology | Admitting: Radiation Oncology

## 2016-10-15 DIAGNOSIS — Z510 Encounter for antineoplastic radiation therapy: Secondary | ICD-10-CM | POA: Diagnosis not present

## 2016-10-18 ENCOUNTER — Ambulatory Visit
Admission: RE | Admit: 2016-10-18 | Discharge: 2016-10-18 | Disposition: A | Discharge status: Home / Self Care | Payer: Medicare Other | Source: Ambulatory Visit | Attending: Radiation Oncology | Admitting: Radiation Oncology

## 2016-10-18 DIAGNOSIS — Z510 Encounter for antineoplastic radiation therapy: Secondary | ICD-10-CM | POA: Diagnosis not present

## 2016-10-19 ENCOUNTER — Ambulatory Visit
Admission: RE | Admit: 2016-10-19 | Discharge: 2016-10-19 | Disposition: A | Discharge status: Home / Self Care | Payer: Medicare Other | Source: Ambulatory Visit | Attending: Radiation Oncology | Admitting: Radiation Oncology

## 2016-10-19 DIAGNOSIS — Z510 Encounter for antineoplastic radiation therapy: Secondary | ICD-10-CM | POA: Diagnosis not present

## 2016-10-20 ENCOUNTER — Ambulatory Visit
Admission: RE | Admit: 2016-10-20 | Discharge: 2016-10-20 | Disposition: A | Discharge status: Home / Self Care | Payer: Medicare Other | Source: Ambulatory Visit | Attending: Radiation Oncology | Admitting: Radiation Oncology

## 2016-10-20 DIAGNOSIS — Z510 Encounter for antineoplastic radiation therapy: Secondary | ICD-10-CM | POA: Diagnosis not present

## 2016-10-21 ENCOUNTER — Ambulatory Visit
Admission: RE | Admit: 2016-10-21 | Discharge: 2016-10-21 | Disposition: A | Discharge status: Home / Self Care | Payer: Medicare Other | Source: Ambulatory Visit | Attending: Radiation Oncology | Admitting: Radiation Oncology

## 2016-10-21 DIAGNOSIS — Z510 Encounter for antineoplastic radiation therapy: Secondary | ICD-10-CM | POA: Diagnosis not present

## 2016-10-22 ENCOUNTER — Ambulatory Visit
Admission: RE | Admit: 2016-10-22 | Discharge: 2016-10-22 | Disposition: A | Discharge status: Home / Self Care | Payer: Medicare Other | Source: Ambulatory Visit | Attending: Radiation Oncology | Admitting: Radiation Oncology

## 2016-10-22 DIAGNOSIS — Z510 Encounter for antineoplastic radiation therapy: Secondary | ICD-10-CM | POA: Diagnosis not present

## 2016-10-25 ENCOUNTER — Ambulatory Visit
Admission: RE | Admit: 2016-10-25 | Discharge: 2016-10-25 | Disposition: A | Discharge status: Home / Self Care | Payer: Medicare Other | Source: Ambulatory Visit | Attending: Radiation Oncology | Admitting: Radiation Oncology

## 2016-10-25 DIAGNOSIS — Z510 Encounter for antineoplastic radiation therapy: Secondary | ICD-10-CM | POA: Diagnosis not present

## 2016-10-26 ENCOUNTER — Ambulatory Visit
Admission: RE | Admit: 2016-10-26 | Discharge: 2016-10-26 | Disposition: A | Discharge status: Home / Self Care | Payer: Medicare Other | Source: Ambulatory Visit | Attending: Radiation Oncology | Admitting: Radiation Oncology

## 2016-10-26 ENCOUNTER — Ambulatory Visit: Payer: Medicare Other

## 2016-10-26 DIAGNOSIS — Z510 Encounter for antineoplastic radiation therapy: Secondary | ICD-10-CM | POA: Diagnosis not present

## 2016-10-27 ENCOUNTER — Ambulatory Visit
Admission: RE | Admit: 2016-10-27 | Discharge: 2016-10-27 | Disposition: A | Discharge status: Home / Self Care | Payer: Medicare Other | Source: Ambulatory Visit | Attending: Radiation Oncology | Admitting: Radiation Oncology

## 2016-10-27 ENCOUNTER — Encounter: Payer: Self-pay | Admitting: Radiation Oncology

## 2016-10-27 DIAGNOSIS — Z510 Encounter for antineoplastic radiation therapy: Secondary | ICD-10-CM | POA: Diagnosis not present

## 2016-10-29 NOTE — Progress Notes (Signed)
  Radiation Oncology         [phone]) [phone] ________________________________  Name: [name] MRN: [id number]  Date: 10/27/2016  DOB: Jun 25, 1940  End of Treatment Note  Diagnosis:   76 y.o. man with stage T2a prostate cancer with Gleason's score of 4+4 and PSA of 29.5     Indication for treatment:  Curative, Definitive Radiotherapy       Radiation treatment dates:   08/31/2016 - 10/27/2016  Site/dose:   The prostate was treated to a total of 75 Gy, with an initial dose of 45 Gy delivered in 25 fractions of 1.8 Gy followed by a boost dose of 30 Gy delivered in 15 fractions of 2 Gy.  Beams/energy:   The patient was treated with IMRT using volumetric arc therapy delivering 6 MV X-rays to clockwise and counterclockwise circumferential arcs with a 90 degree collimator offset to avoid dose scalloping.  Image guidance was performed with daily cone beam CT prior to each fraction to align to gold markers in the prostate and assure proper bladder and rectal fill volumes.  Immobilization was achieved with BodyFix custom mold.  Narrative: The patient tolerated radiation treatment relatively well.   The patient experienced modest fatigue some minor urinary irritation, including nocturia x1-3 and occasional urinary urgency. He denied pain except for occasional dysuria that he described as burning. He denied hematuria, leakage or incontinence. He also denied any constipation but stated that he did have some diarrhea, relieved with Imodium.   Plan: The patient has completed radiation treatment. He will return to radiation oncology clinic for routine followup in one month. I advised him to call or return sooner if he has any questions or concerns related to his recovery or treatment. ________________________________  Sheral Apley. Tammi Klippel, M.D.  This document serves as a record of services personally performed by Tyler Pita, MD. It was created on his behalf by Rae Lips, a trained medical scribe.  The creation of this record is based on the scribe's personal observations and the provider's statements to them. This document has been checked and approved by the attending provider.

## 2017-02-19 ENCOUNTER — Other Ambulatory Visit: Payer: Self-pay | Admitting: Physician Assistant

## 2017-02-19 DIAGNOSIS — I2510 Atherosclerotic heart disease of native coronary artery without angina pectoris: Secondary | ICD-10-CM

## 2017-02-21 ENCOUNTER — Telehealth: Payer: Self-pay | Admitting: Cardiology

## 2017-02-21 DIAGNOSIS — I2510 Atherosclerotic heart disease of native coronary artery without angina pectoris: Secondary | ICD-10-CM

## 2017-02-21 MED ORDER — CLOPIDOGREL BISULFATE 75 MG PO TABS: 75.0000 mg | ORAL_TABLET | Freq: Every day | ORAL | 1 refills | Status: DC

## 2017-02-21 NOTE — Telephone Encounter (Signed)
New message      *STAT* If patient is at the pharmacy, call can be transferred to refill team.   1. Which medications need to be refilled? (please list name of each medication and dose if known) clopidogrel (PLAVIX) 75 MG tablet 2. Which pharmacy/location (including street and city if local pharmacy) is medication to be sent to?[facility]  3. Do they need a 30 day or 90 day supply? [place]

## 2017-02-21 NOTE — Telephone Encounter (Signed)
Pt's medication was sent to pt's pharmacy as requested. Confirmation received.  °

## 2017-02-23 ENCOUNTER — Other Ambulatory Visit: Payer: Self-pay | Admitting: Physician Assistant

## 2017-03-01 ENCOUNTER — Telehealth: Payer: Self-pay

## 2017-03-01 NOTE — Telephone Encounter (Signed)
   [facility] Pre-operative Risk Assessment    Request for surgical clearance:  1. What type of surgery is being performed? colonoscopy   2. When is this surgery scheduled? 04/22/17   3. Are there any medications that need to be held prior to surgery and how long?Plavix 5 days prior to procedure   4. Practice name and name of physician performing surgery? [facility] // Dr. Penelope Coop  5. What is your office phone and fax number?  Ph [phone]  [phone]   6. Anesthesia type (None, local, MAC, general) ? Propofol   Tod Persia 03/01/2017, 12:22 PM  _________________________________________________________________   (provider comments below)

## 2017-03-02 NOTE — Telephone Encounter (Signed)
   Primary Cardiologist:No primary care provider on file.  Chart reviewed as part of pre-operative protocol coverage. Because of [name]'s past medical history and time since last visit, he/she will require a follow-up visit in order to better assess preoperative cardiovascular risk.  Pre-op covering staff: - Please schedule appointment and call patient to inform them. - Please contact requesting surgeon's office via preferred method (i.e, phone, fax) to inform them of need for appointment prior to surgery.  Please also give recommendations concerning holding plavix for colonoscopy.  Tami Lin Duke, PA  03/02/2017, 1:36 PM

## 2017-03-02 NOTE — Telephone Encounter (Signed)
Called patient on both contact number to schedule appointment for preop clearance. Left messages on both lines. Waiting for a call back.

## 2017-03-03 NOTE — Telephone Encounter (Signed)
Called patient and scheduled to see Dr. Radford Pax on Feb 21 @ 11:20am.   [name] GI and left message for [facility] (Dr. Estell Harpin assistant) regarding status of clearance/pending appt.

## 2017-03-03 NOTE — Telephone Encounter (Signed)
Follow Up: ° ° ° °Returning a call from yesterday. °

## 2017-03-10 ENCOUNTER — Encounter: Payer: Self-pay | Admitting: Cardiology

## 2017-03-17 ENCOUNTER — Encounter: Payer: Self-pay | Admitting: Cardiology

## 2017-03-17 ENCOUNTER — Ambulatory Visit: Payer: Medicare Other | Admitting: Cardiology

## 2017-03-17 VITALS — BP 124/62 | HR 66 | Ht 68.0 in | Wt 154.6 lb

## 2017-03-17 DIAGNOSIS — E1100 Type 2 diabetes mellitus with hyperosmolarity without nonketotic hyperglycemic-hyperosmolar coma (NKHHC): Secondary | ICD-10-CM | POA: Diagnosis not present

## 2017-03-17 DIAGNOSIS — E7800 Pure hypercholesterolemia, unspecified: Secondary | ICD-10-CM

## 2017-03-17 DIAGNOSIS — I2510 Atherosclerotic heart disease of native coronary artery without angina pectoris: Secondary | ICD-10-CM | POA: Diagnosis not present

## 2017-03-17 DIAGNOSIS — E119 Type 2 diabetes mellitus without complications: Secondary | ICD-10-CM | POA: Insufficient documentation

## 2017-03-17 NOTE — Progress Notes (Signed)
Cardiology Office Note:    Date:  03/17/2017   ID:  [name], DOB Sep 20, 1940, MRN [id number]  PCP:  Lujean Amel, MD  Cardiologist:  No primary care provider on file.    Referring MD: Lujean Amel, MD   Chief Complaint  Patient presents with  . Coronary Artery Disease  . Hyperlipidemia    History of Present Illness:    [name] is a 77 y.o. male with a hx of ASCAD with one vessel CAD status post PCI of left circumflex and OM2 in 2009 and PCI of left circ 2010 for restenosis.  Stress Myoview 11/17 showed normal  EF 57% with no ischemia.  He also has a history of type 2 DM diet controlled and hyperlipidemia. He is here today for followup and is doing well.  He denies any chest pain or pressure, SOB, DOE, PND, orthopnea,  dizziness, palpitations, claudication or syncope.  He has had some mild LE edema which he thinks if related to the Lupron shots he is getting for his prostate CA.   He is compliant with his meds and is tolerating meds with no SE.  He has just joined a gym and walks on the treadmill with no problems.  He gets his HR up to 130bpm for 30 minutes on the treadmill and tolerates it well.  He has been having some constipation and diarrhea related to the Lupron.  He is here today to get clearance for a colonoscopy.    Past Medical History:  Diagnosis Date  . CAD (coronary artery disease)    1 vessel CAD status post PCI of LCx and OM2 in 2009 and PCI of LCx 2010 for restenosis. Myoview 11/17: EF 57% with no ischemia  . Colon polyp 2006   repeat in 2 years  . COPD (chronic obstructive pulmonary disease) ([place])   . Diabetes mellitus without complication (HCC)    Diet-controlled non-insulin-dependent  . Erectile dysfunction   . History of tobacco abuse 04/2007   COPD on CXR  . Hyperlipidemia LDL goal <70   . Prostate cancer [facility])     Past Surgical History:  Procedure Laterality Date  . PERCUTANEOUS CORONARY STENT INTERVENTION (PCI-S)  05/2007   PCI of LCx  03/2008 for stenosis above prior stent  . PROSTATE BIOPSY      Current Medications: Current Meds  Medication Sig  . Ascorbic Acid (VITAMIN C) 100 MG tablet Take 100 mg by mouth every other day.  Marland Kitchen aspirin EC 81 MG tablet Take 81 mg by mouth daily.  . clopidogrel (PLAVIX) 75 MG tablet Take 1 tablet (75 mg total) by mouth daily. Please keep upcoming appt for future refills. Thank you  . CRESTOR 20 MG tablet TAKE ONE TABLET EACH DAY  . lisinopril (PRINIVIL,ZESTRIL) 2.5 MG tablet Take 2.5 mg by mouth daily. Patient doesn't take this to manage BP. He takes this to prevent protein spillage.  . metFORMIN (GLUCOPHAGE) 500 MG tablet Take 1,000 mg by mouth 2 (two) times daily with a meal.  . miconazole (MICOTIN) 2 % powder Apply topically as needed for itching.  Marland Kitchen NITROSTAT 0.4 MG SL tablet TAKE AS DIRECTED  . sildenafil (VIAGRA) 50 MG tablet Take 50 mg by mouth daily as needed for erectile dysfunction.  . tamsulosin (FLOMAX) 0.4 MG CAPS capsule Take 1 capsule (0.4 mg total) by mouth at bedtime.  . Vitamin D, Cholecalciferol, 1000 UNITS TABS Take 1 tablet by mouth daily.     Allergies:   Sulfa  antibiotics and Zocor [simvastatin]   Social History   Socioeconomic History  . Marital status: Single    Spouse name: None  . Number of children: None  . Years of education: None  . Highest education level: None  Social Needs  . Financial resource strain: None  . Food insecurity - worry: None  . Food insecurity - inability: None  . Transportation needs - medical: None  . Transportation needs - non-medical: None  Occupational History  . None  Tobacco Use  . Smoking status: Former Smoker    Packs/day: 0.25    Years: 40.00    Pack years: 10.00    Types: Cigarettes    Last attempt to quit: 12/21/2003    Years since quitting: 13.2  . Smokeless tobacco: Never Used  Substance and Sexual Activity  . Alcohol use: Yes    Comment: ocassional beer or glass of wine  . Drug use: No  . Sexual  activity: Yes  Other Topics Concern  . None  Social History Narrative  . None     Family History: The patient's family history includes Arthritis in his mother; Cancer in his sister; Diabetes in his mother; Heart attack in his father.  ROS:   Please see the history of present illness.    Review of Systems  Constitution: Positive for weight gain.  Musculoskeletal: Positive for muscle cramps.  Gastrointestinal: Positive for constipation and diarrhea.    All other systems reviewed and negative.   EKGs/Labs/Other Studies Reviewed:    The following studies were reviewed today: none  EKG:  EKG is ordered today.  The ekg ordered today demonstrates NSR with no ST changes  Recent Labs: No results found for requested labs within last 8760 hours.   Recent Lipid Panel    Component Value Date/Time   CHOL 111 02/26/2014 1220   TRIG 155 (H) 02/26/2014 1220   HDL 31 (L) 02/26/2014 1220   CHOLHDL 4.8 04/02/2008 0610   VLDL 40 04/02/2008 0610   LDLCALC 49 02/26/2014 1220    Physical Exam:    VS:  BP 124/62   Pulse 66   Ht 5\' 8"  (1.727 m)   Wt 154 lb 9.6 oz (70.1 kg)   SpO2 98%   BMI 23.51 kg/m     Wt Readings from Last 3 Encounters:  03/17/17 154 lb 9.6 oz (70.1 kg)  06/09/16 141 lb 6.4 oz (64.1 kg)  12/25/15 144 lb (65.3 kg)     GEN:  Well nourished, well developed in no acute distress HEENT: Normal NECK: No JVD; No carotid bruits LYMPHATICS: No lymphadenopathy CARDIAC: RRR, no murmurs, rubs, gallops RESPIRATORY:  Clear to auscultation without rales, wheezing or rhonchi  ABDOMEN: Soft, non-tender, non-distended MUSCULOSKELETAL:  No edema; No deformity  SKIN: Warm and dry NEUROLOGIC:  Alert and oriented x 3 PSYCHIATRIC:  Normal affect   ASSESSMENT:    1. Coronary artery disease involving native coronary artery of native heart without angina pectoris   2. Pure hypercholesterolemia   3. Type 2 diabetes mellitus with hyperosmolarity without coma, without long-term  current use of insulin (HCC)    PLAN:    In order of problems listed above:  1.  ASCAD - 1 vessel CAD status post PCI of LCx and OM2 in 2009 and PCI of LCx 2010 for restenosis. Myoview 11/17: EF 57% with no ischemia.  He denies any anginal symptoms.  He will continue on ASA 81mg  daily, Plavix 75mg  daily and statin. I have told him  that he is fine to hold his Plavix for his colonoscopy.    2.  Hyperlipidemia with LDL goal < 70.  She will continue on crestor 20mg  daily. I will get an FLP and ALT.   3.  Type 2 DM - diet controlled and followed by PCP.     Medication Adjustments/Labs and Tests Ordered: Current medicines are reviewed at length with the patient today.  Concerns regarding medicines are outlined above.  No orders of the defined types were placed in this encounter.  No orders of the defined types were placed in this encounter.   Signed, Fransico Him, MD  03/17/2017 11:46 AM    [place]

## 2017-03-17 NOTE — Patient Instructions (Signed)
Medication Instructions:  Your physician recommends that you continue on your current medications as directed. Please refer to the Current Medication list given to you today.  Labwork: Your physician recommends that you return for lab work in: 1 week for liver function test and fasting lipids.   Testing/Procedures: None ordered   Follow-Up: Your physician wants you to follow-up in: 1 year with Dr. Radford Pax. You will receive a reminder letter in the mail two months in advance. If you don't receive a letter, please call our office to schedule the follow-up appointment.  Any Other Special Instructions Will Be Listed Below (If Applicable).  Thank you for choosing [facility], RN  [phone]   If you need a refill on your cardiac medications before your next appointment, please call your pharmacy.

## 2017-03-22 ENCOUNTER — Other Ambulatory Visit: Payer: Medicare Other

## 2017-04-05 ENCOUNTER — Telehealth: Payer: Self-pay

## 2017-04-05 NOTE — Telephone Encounter (Signed)
   [facility] Pre-operative Risk Assessment    Request for surgical clearance:  1. What type of surgery is being performed? Colonoscopy   2. When is this surgery scheduled? 04/22/17   3. What type of clearance is required (medical clearance vs. Pharmacy clearance to hold med vs. Both)? Pharmacy clearance to hold med  4. Are there any medications that need to be held prior to surgery and how long? Plavix - Okay to hold for five days before procedure?  5. Practice name and name of physician performing surgery? [facility] - Dr. Penelope Coop   6. What is your office phone and fax number? Phone: [phone], Fax: [phone]  7. Anesthesia type (None, local, MAC, general) ? None listed   [name] 04/05/2017, 3:42 PM  _________________________________________________________________   (provider comments below)

## 2017-04-07 NOTE — Telephone Encounter (Signed)
   Primary Cardiologist: Dr. Radford Pax   Chart reviewed as part of pre-operative protocol coverage. Given past medical history and time since last visit, based on ACC/AHA guidelines, [facility]BR[facility] would be at acceptable risk for the planned procedure without further cardiovascular testing.   Dr. Radford Pax granted clearance to hold Plavix for colonoscopy in her office note 03/17/17. Ok to hold for 5 days prior.   I will route this recommendation to the requesting party via Epic fax function and remove from pre-op pool.  Please call with questions.  Lyda Jester, PA-C 04/07/2017, 1:49 PM

## 2017-04-08 DIAGNOSIS — M65331 Trigger finger, right middle finger: Secondary | ICD-10-CM | POA: Insufficient documentation

## 2017-04-21 ENCOUNTER — Ambulatory Visit: Payer: Medicare Other | Admitting: Cardiology

## 2017-05-24 ENCOUNTER — Other Ambulatory Visit: Payer: Self-pay | Admitting: Cardiology

## 2017-08-22 ENCOUNTER — Telehealth: Payer: Self-pay | Admitting: Cardiology

## 2017-08-22 NOTE — Telephone Encounter (Signed)
° °  [facility] Pre-operative Risk Assessment    Request for surgical clearance:  1. What type of surgery is being performed? Remove bladder stone  2. When is this surgery scheduled? TBD  3. What type of clearance is required (medical clearance vs. Pharmacy clearance to hold med vs. Both)? both  4. Are there any medications that need to be held prior to surgery and how long? Plavix  5. Practice name and name of physician performing surgery? Dr Gloriann Loan, [facility]  6. What is your office phone number [phone] ext [phone] Pam   7.   What is your office fax number [phone]  8.   Anesthesia type (None, local, MAC, general) ? general   Laurier Nancy 08/22/2017, 1:13 PM  _________________________________________________________________   (provider comments below)

## 2017-08-26 NOTE — Telephone Encounter (Signed)
   Primary Cardiologist: Fransico Him, MD  Chart reviewed as part of pre-operative protocol coverage. Patient was contacted 08/26/2017 in reference to pre-operative risk assessment for pending surgery as outlined below.  [name] was last seen 02/2017 by Dr. Radford Pax. H/o CAD s/p PCI (last in 2010), negative nuc 2017, DM, HLD, prostate CA, COPD.  Since that day, [name] has done fine without any new cardiac symptoms or events. No CP or SOB. He is regularly active without complaint.  Therefore, based on ACC/AHA guidelines, the patient would be at acceptable risk for the planned procedure without further cardiovascular testing.   Dr. Radford Pax granted clearance to hold Plavix for colonoscopy in her office note 03/17/17, therefore, the same recommendation would hold here as no new cardiac/stroke events have been noted. Ok to hold for 5 days prior.   I will route this recommendation to the requesting party via Epic fax function and remove from pre-op pool.  Please call with questions.  Charlie Pitter, PA-C 08/26/2017, 9:18 AM

## 2017-09-06 ENCOUNTER — Other Ambulatory Visit: Payer: Self-pay | Admitting: Urology

## 2017-09-12 NOTE — Patient Instructions (Addendum)
ROMON DEVEREUX  09/12/2017   Your procedure is scheduled on: 09/21/2017   Report to [facility] Main  Entrance    Report to admitting at   9:15 AM    Call this number if you have problems the morning of surgery [phone]   Remember: Do not eat food or drink liquids :After Midnight.     Take these medicines the morning of surgery with A SIP OF WATER: none    DO NOT TAKE ANY DIABETIC MEDICATIONS DAY OF YOUR SURGERY                               You may not have any metal on your body including hair pins and              piercings  Do not wear jewelry, , lotions, powders or perfumes, deodorant                         Men may shave face and neck.   Do not bring valuables to the hospital. [place].  Contacts, dentures or bridgework may not be worn into surgery.       Patients discharged the day of surgery will not be allowed to drive home.  Name and phone number of your driver:                Please read over the following fact sheets you were given: _____________________________________________________________________             [facility] - Preparing for Surgery Before surgery, you can play an important role.  Because skin is not sterile, your skin needs to be as free of germs as possible.  You can reduce the number of germs on your skin by washing with CHG (chlorahexidine gluconate) soap before surgery.  CHG is an antiseptic cleaner which kills germs and bonds with the skin to continue killing germs even after washing. Please DO NOT use if you have an allergy to CHG or antibacterial soaps.  If your skin becomes reddened/irritated stop using the CHG and inform your nurse when you arrive at Short Stay. Do not shave (including legs and underarms) for at least 48 hours prior to the first CHG shower.  You may shave your face/neck. Please follow these instructions carefully:  1.  Shower with CHG Soap  the night before surgery and the  morning of Surgery.  2.  If you choose to wash your hair, wash your hair first as usual with your  normal  shampoo.  3.  After you shampoo, rinse your hair and body thoroughly to remove the  shampoo.                           4.  Use CHG as you would any other liquid soap.  You can apply chg directly  to the skin and wash                       Gently with a scrungie or clean washcloth.  5.  Apply the CHG Soap to your body ONLY FROM THE NECK DOWN.   Do not use on  face/ open                           Wound or open sores. Avoid contact with eyes, ears mouth and genitals (private parts).                       Wash face,  Genitals (private parts) with your normal soap.             6.  Wash thoroughly, paying special attention to the area where your surgery  will be performed.  7.  Thoroughly rinse your body with warm water from the neck down.  8.  DO NOT shower/wash with your normal soap after using and rinsing off  the CHG Soap.                9.  Pat yourself dry with a clean towel.            10.  Wear clean pajamas.            11.  Place clean sheets on your bed the night of your first shower and do not  sleep with pets. Day of Surgery : Do not apply any lotions/deodorants the morning of surgery.  Please wear clean clothes to the hospital/surgery center.  FAILURE TO FOLLOW THESE INSTRUCTIONS MAY RESULT IN THE CANCELLATION OF YOUR SURGERY PATIENT SIGNATURE_________________________________  NURSE SIGNATURE__________________________________  ________________________________________________________________________

## 2017-09-13 ENCOUNTER — Other Ambulatory Visit: Payer: Self-pay

## 2017-09-13 ENCOUNTER — Encounter (HOSPITAL_COMMUNITY)
Admission: RE | Admit: 2017-09-13 | Discharge: 2017-09-13 | Disposition: A | Discharge status: Home / Self Care | Payer: Medicare Other | Source: Ambulatory Visit | Attending: Urology | Admitting: Urology

## 2017-09-13 ENCOUNTER — Encounter (HOSPITAL_COMMUNITY): Payer: Self-pay

## 2017-09-13 DIAGNOSIS — Z01812 Encounter for preprocedural laboratory examination: Secondary | ICD-10-CM | POA: Diagnosis present

## 2017-09-13 DIAGNOSIS — E785 Hyperlipidemia, unspecified: Secondary | ICD-10-CM | POA: Diagnosis not present

## 2017-09-13 DIAGNOSIS — N210 Calculus in bladder: Secondary | ICD-10-CM | POA: Diagnosis not present

## 2017-09-13 DIAGNOSIS — Z87891 Personal history of nicotine dependence: Secondary | ICD-10-CM | POA: Diagnosis not present

## 2017-09-13 DIAGNOSIS — J449 Chronic obstructive pulmonary disease, unspecified: Secondary | ICD-10-CM | POA: Diagnosis not present

## 2017-09-13 DIAGNOSIS — E119 Type 2 diabetes mellitus without complications: Secondary | ICD-10-CM | POA: Diagnosis not present

## 2017-09-13 DIAGNOSIS — C61 Malignant neoplasm of prostate: Secondary | ICD-10-CM | POA: Diagnosis not present

## 2017-09-13 LAB — GLUCOSE, CAPILLARY: GLUCOSE-CAPILLARY: 206 mg/dL — AB (ref 70–99)

## 2017-09-13 LAB — BASIC METABOLIC PANEL
ANION GAP: 10 (ref 5–15)
BUN: 14 mg/dL (ref 8–23)
CALCIUM: 9.6 mg/dL (ref 8.9–10.3)
CO2: 27 mmol/L (ref 22–32)
Chloride: 103 mmol/L (ref 98–111)
Creatinine, Ser: 0.76 mg/dL (ref 0.61–1.24)
GFR calc Af Amer: >60 mL/min (ref >60)
GLUCOSE: 212 mg/dL — AB (ref 70–99)
POTASSIUM: 4.6 mmol/L (ref 3.5–5.1)
SODIUM: 140 mmol/L (ref 135–145)

## 2017-09-13 LAB — CBC
HEMATOCRIT: 39.8 % (ref 39.0–52.0)
Hemoglobin: 14 g/dL (ref 13.0–17.0)
MCH: 31.5 pg (ref 26.0–34.0)
MCHC: 35.2 g/dL (ref 30.0–36.0)
MCV: 89.6 fL (ref 78.0–100.0)
Platelets: 249 10*3/uL (ref 150–400)
RBC: 4.44 MIL/uL (ref 4.22–5.81)
RDW: 12.8 % (ref 11.5–15.5)
WBC: 3.8 10*3/uL — AB (ref 4.0–10.5)

## 2017-09-13 LAB — HEMOGLOBIN A1C
HEMOGLOBIN A1C: 7.9 % — AB (ref 4.8–5.6)
MEAN PLASMA GLUCOSE: 180.03 mg/dL

## 2017-09-21 ENCOUNTER — Ambulatory Visit (HOSPITAL_COMMUNITY): Payer: Medicare Other | Admitting: Certified Registered Nurse Anesthetist

## 2017-09-21 ENCOUNTER — Ambulatory Visit (HOSPITAL_COMMUNITY)
Admission: RE | Admit: 2017-09-21 | Discharge: 2017-09-21 | Disposition: A | Discharge status: Home / Self Care | Payer: Medicare Other | Source: Ambulatory Visit | Attending: Urology | Admitting: Urology

## 2017-09-21 ENCOUNTER — Encounter (HOSPITAL_COMMUNITY)
Admission: RE | Disposition: A | Discharge status: Home / Self Care | Payer: Self-pay | Source: Ambulatory Visit | Attending: Urology

## 2017-09-21 ENCOUNTER — Encounter (HOSPITAL_COMMUNITY): Payer: Self-pay | Admitting: General Practice

## 2017-09-21 DIAGNOSIS — N401 Enlarged prostate with lower urinary tract symptoms: Secondary | ICD-10-CM | POA: Insufficient documentation

## 2017-09-21 DIAGNOSIS — Z87891 Personal history of nicotine dependence: Secondary | ICD-10-CM | POA: Diagnosis not present

## 2017-09-21 DIAGNOSIS — E119 Type 2 diabetes mellitus without complications: Secondary | ICD-10-CM | POA: Diagnosis not present

## 2017-09-21 DIAGNOSIS — Z7902 Long term (current) use of antithrombotics/antiplatelets: Secondary | ICD-10-CM | POA: Insufficient documentation

## 2017-09-21 DIAGNOSIS — E7800 Pure hypercholesterolemia, unspecified: Secondary | ICD-10-CM | POA: Diagnosis not present

## 2017-09-21 DIAGNOSIS — C61 Malignant neoplasm of prostate: Secondary | ICD-10-CM | POA: Insufficient documentation

## 2017-09-21 DIAGNOSIS — I2510 Atherosclerotic heart disease of native coronary artery without angina pectoris: Secondary | ICD-10-CM | POA: Diagnosis not present

## 2017-09-21 DIAGNOSIS — Z7984 Long term (current) use of oral hypoglycemic drugs: Secondary | ICD-10-CM | POA: Insufficient documentation

## 2017-09-21 DIAGNOSIS — Z7982 Long term (current) use of aspirin: Secondary | ICD-10-CM | POA: Insufficient documentation

## 2017-09-21 DIAGNOSIS — N210 Calculus in bladder: Secondary | ICD-10-CM | POA: Diagnosis present

## 2017-09-21 DIAGNOSIS — N138 Other obstructive and reflux uropathy: Secondary | ICD-10-CM | POA: Insufficient documentation

## 2017-09-21 DIAGNOSIS — J449 Chronic obstructive pulmonary disease, unspecified: Secondary | ICD-10-CM | POA: Insufficient documentation

## 2017-09-21 DIAGNOSIS — Z79899 Other long term (current) drug therapy: Secondary | ICD-10-CM | POA: Diagnosis not present

## 2017-09-21 DIAGNOSIS — I10 Essential (primary) hypertension: Secondary | ICD-10-CM | POA: Insufficient documentation

## 2017-09-21 HISTORY — PX: CYSTOSCOPY WITH LITHOLAPAXY: SHX1425

## 2017-09-21 LAB — GLUCOSE, CAPILLARY: GLUCOSE-CAPILLARY: 182 mg/dL — AB (ref 70–99)

## 2017-09-21 LAB — PROTIME-INR
INR: 0.97
PROTHROMBIN TIME: 12.8 s (ref 11.4–15.2)

## 2017-09-21 SURGERY — CYSTOSCOPY, WITH BLADDER CALCULUS LITHOLAPAXY
Anesthesia: General

## 2017-09-21 MED ORDER — FENTANYL CITRATE (PF) 100 MCG/2ML IJ SOLN
INTRAMUSCULAR | Status: DC | PRN
  Administered 2017-09-21 (×2): 25 ug via INTRAVENOUS

## 2017-09-21 MED ORDER — FENTANYL CITRATE (PF) 100 MCG/2ML IJ SOLN
INTRAMUSCULAR | Status: AC
  Filled 2017-09-21: qty 2

## 2017-09-21 MED ORDER — KETOROLAC TROMETHAMINE 30 MG/ML IJ SOLN: 15.0000 mg | Freq: Once | INTRAMUSCULAR | Status: DC | PRN

## 2017-09-21 MED ORDER — LIDOCAINE 2% (20 MG/ML) 5 ML SYRINGE
INTRAMUSCULAR | Status: AC
  Filled 2017-09-21: qty 5

## 2017-09-21 MED ORDER — PROPOFOL 10 MG/ML IV BOLUS
INTRAVENOUS | Status: DC | PRN
  Administered 2017-09-21: 150 mg via INTRAVENOUS

## 2017-09-21 MED ORDER — LACTATED RINGERS IV SOLN
Freq: Once | INTRAVENOUS | Status: AC
  Administered 2017-09-21: 10:00:00 via INTRAVENOUS

## 2017-09-21 MED ORDER — DEXAMETHASONE SODIUM PHOSPHATE 10 MG/ML IJ SOLN
INTRAMUSCULAR | Status: DC | PRN
  Administered 2017-09-21: 10 mg via INTRAVENOUS

## 2017-09-21 MED ORDER — ONDANSETRON HCL 4 MG/2ML IJ SOLN
INTRAMUSCULAR | Status: DC | PRN
  Administered 2017-09-21: 4 mg via INTRAVENOUS

## 2017-09-21 MED ORDER — FENTANYL CITRATE (PF) 100 MCG/2ML IJ SOLN: 25.0000 ug | INTRAMUSCULAR | Status: DC | PRN

## 2017-09-21 MED ORDER — DEXAMETHASONE SODIUM PHOSPHATE 10 MG/ML IJ SOLN
INTRAMUSCULAR | Status: AC
  Filled 2017-09-21: qty 1

## 2017-09-21 MED ORDER — ONDANSETRON HCL 4 MG/2ML IJ SOLN
INTRAMUSCULAR | Status: AC
  Filled 2017-09-21: qty 2

## 2017-09-21 MED ORDER — SODIUM CHLORIDE 0.9 % IR SOLN
Status: DC | PRN
  Administered 2017-09-21: 3000 mL via INTRAVESICAL

## 2017-09-21 MED ORDER — TRAMADOL HCL 50 MG PO TABS: 50.0000 mg | ORAL_TABLET | Freq: Four times a day (QID) | ORAL | 0 refills | Status: AC | PRN

## 2017-09-21 MED ORDER — LIDOCAINE 2% (20 MG/ML) 5 ML SYRINGE
INTRAMUSCULAR | Status: DC | PRN
  Administered 2017-09-21: 100 mg via INTRAVENOUS

## 2017-09-21 MED ORDER — CEFAZOLIN SODIUM-DEXTROSE 2-4 GM/100ML-% IV SOLN
2.0000 g | INTRAVENOUS | Status: AC
  Administered 2017-09-21: 2 g via INTRAVENOUS
  Filled 2017-09-21: qty 100

## 2017-09-21 MED ORDER — PROPOFOL 10 MG/ML IV BOLUS
INTRAVENOUS | Status: AC
  Filled 2017-09-21: qty 20

## 2017-09-21 MED ORDER — PROMETHAZINE HCL 25 MG/ML IJ SOLN: 6.2500 mg | INTRAMUSCULAR | Status: DC | PRN

## 2017-09-21 MED ORDER — LACTATED RINGERS IV SOLN
INTRAVENOUS | Status: DC | PRN
  Administered 2017-09-21: 10:00:00 via INTRAVENOUS

## 2017-09-21 SURGICAL SUPPLY — 12 items
BAG URO CATCHER STRL LF (MISCELLANEOUS) ×2 IMPLANT
CLOTH BEACON ORANGE TIMEOUT ST (SAFETY) ×2 IMPLANT
FIBER LASER FLEXIVA 1000 (UROLOGICAL SUPPLIES) ×2 IMPLANT
FIBER LASER FLEXIVA 550 (UROLOGICAL SUPPLIES) IMPLANT
GLOVE BIO SURGEON STRL SZ7.5 (GLOVE) ×2 IMPLANT
GOWN STRL REUS W/TWL LRG LVL3 (GOWN DISPOSABLE) IMPLANT
GOWN STRL REUS W/TWL XL LVL3 (GOWN DISPOSABLE) ×2 IMPLANT
MANIFOLD NEPTUNE II (INSTRUMENTS) ×2 IMPLANT
NS IRRIG 1000ML POUR BTL (IV SOLUTION) ×2 IMPLANT
PACK CYSTO (CUSTOM PROCEDURE TRAY) ×2 IMPLANT
SYRINGE IRR TOOMEY STRL 70CC (SYRINGE) IMPLANT
TUBING CONNECTING 10 (TUBING) ×2 IMPLANT

## 2017-09-21 NOTE — Anesthesia Preprocedure Evaluation (Signed)
Anesthesia Evaluation  Patient identified by MRN, date of birth, ID band Patient awake    Reviewed: Allergy & Precautions, NPO status , Patient's Chart, lab work & pertinent test results  Airway Mallampati: II  TM Distance: >3 FB Neck ROM: Full    Dental no notable dental hx.    Pulmonary COPD, former smoker,    Pulmonary exam normal breath sounds clear to auscultation       Cardiovascular + CAD  Normal cardiovascular exam Rhythm:Regular Rate:Normal     Neuro/Psych negative neurological ROS  negative psych ROS   GI/Hepatic negative GI ROS, Neg liver ROS,   Endo/Other  diabetes  Renal/GU negative Renal ROS  negative genitourinary   Musculoskeletal negative musculoskeletal ROS (+)   Abdominal   Peds negative pediatric ROS (+)  Hematology negative hematology ROS (+)   Anesthesia Other Findings   Reproductive/Obstetrics negative OB ROS                             Anesthesia Physical Anesthesia Plan  ASA: III  Anesthesia Plan: General   Post-op Pain Management:    Induction: Intravenous  PONV Risk Score and Plan: 2 and Ondansetron, Dexamethasone and Treatment may vary due to age or medical condition  Airway Management Planned: LMA  Additional Equipment:   Intra-op Plan:   Post-operative Plan: Extubation in OR  Informed Consent: I have reviewed the patients History and Physical, chart, labs and discussed the procedure including the risks, benefits and alternatives for the proposed anesthesia with the patient or authorized representative who has indicated his/her understanding and acceptance.   Dental advisory given  Plan Discussed with: CRNA and Surgeon  Anesthesia Plan Comments:         Anesthesia Quick Evaluation

## 2017-09-21 NOTE — Op Note (Signed)
Operative Note  Preoperative diagnosis:  1.  Bladder calculus  Postoperative diagnosis: 1.  Bladder calculus-2 cm  Procedure(s): 1.  Cystoscopy lithotripsy less than 2.5 cm  Surgeon: Link Snuffer, MD  Assistants: None  Anesthesia: General  Complications: None immediate  EBL: Minimal  Specimens: 1.  None  Drains/Catheters: 1.  None  Intraoperative findings: 2 cm bladder stone.  Normal urethra and otherwise normal bladder.  He did have an obstructing prostate.  Indication: 77 year old male underwent a hematuria work-up and was found to have a 2 cm bladder stone presents for the previously mentioned operation.  Description of procedure:  The patient was identified and consent was obtained.  The patient was taken to the operating room and placed in the supine position.  The patient was placed under general anesthesia.  Perioperative antibiotics were administered.  The patient was placed in dorsal lithotomy.  Patient was prepped and draped in a standard sterile fashion and a timeout was performed.  A 21 French rigid cystoscope was advanced into the urethra and into the bladder.  A complete cystoscopy was performed with no abnormal findings from the stone.  A laser fiber was used to fragment the stone to smaller fragments.  These were then evacuated with the scope until no stone fragments remained.  The bladder was drained and the scope withdrawn and this concluded the operation.  The patient tolerated the procedure well and was stable postoperatively.  Plan: Hematuria work-up complete.

## 2017-09-21 NOTE — Interval H&P Note (Signed)
History and Physical Interval Note:  09/21/2017 10:06 AM  [name]  has presented today for surgery, with the diagnosis of BLADDER STONE  The various methods of treatment have been discussed with the patient and family. After consideration of risks, benefits and other options for treatment, the patient has consented to  Procedure(s): CYSTOSCOPY WITH LITHOLAPAXY (N/A) as a surgical intervention .  The patient's history has been reviewed, patient examined, no change in status, stable for surgery.  I have reviewed the patient's chart and labs.  Questions were answered to the patient's satisfaction.     Marton Redwood, III

## 2017-09-21 NOTE — Transfer of Care (Signed)
Immediate Anesthesia Transfer of Care Note  Patient: [name]  Procedure(s) Performed: CYSTOSCOPY WITH LITHOLAPAXY (N/A )  Patient Location: PACU  Anesthesia Type:General  Level of Consciousness: awake, alert  and oriented  Airway & Oxygen Therapy: Patient Spontanous Breathing and Patient connected to face mask oxygen  Post-op Assessment: Report given to RN and Post -op Vital signs reviewed and stable  Post vital signs: Reviewed and stable  Last Vitals:  Vitals Value Taken Time  BP    Temp    Pulse 72 09/21/2017 12:27 PM  Resp    SpO2 97 % 09/21/2017 12:27 PM  Vitals shown include unvalidated device data.  Last Pain: There were no vitals filed for this visit.       Complications: No apparent anesthesia complications

## 2017-09-21 NOTE — Anesthesia Procedure Notes (Signed)
Procedure Name: LMA Insertion Date/Time: 09/21/2017 11:55 AM Performed by: Maxwell Caul, CRNA Pre-anesthesia Checklist: Patient identified, Emergency Drugs available, Suction available and Patient being monitored Patient Re-evaluated:Patient Re-evaluated prior to induction Oxygen Delivery Method: Circle system utilized Preoxygenation: Pre-oxygenation with 100% oxygen Induction Type: IV induction LMA: LMA inserted LMA Size: 4.0 Number of attempts: 1 Placement Confirmation: positive ETCO2 and breath sounds checked- equal and bilateral Tube secured with: Tape Dental Injury: Teeth and Oropharynx as per pre-operative assessment

## 2017-09-21 NOTE — Anesthesia Postprocedure Evaluation (Signed)
Anesthesia Post Note  Patient: [name]  Procedure(s) Performed: CYSTOSCOPY WITH LITHOLAPAXY (N/A )     Patient location during evaluation: PACU Anesthesia Type: General Level of consciousness: awake and alert Pain management: pain level controlled Vital Signs Assessment: post-procedure vital signs reviewed and stable Respiratory status: spontaneous breathing, nonlabored ventilation, respiratory function stable and patient connected to nasal cannula oxygen Cardiovascular status: blood pressure returned to baseline and stable Postop Assessment: no apparent nausea or vomiting Anesthetic complications: no    Last Vitals:  Vitals:   09/21/17 1300 09/21/17 1324  BP: 132/70   Pulse: 60 96  Resp: 13 14  Temp: 36.6 C (!) 36.4 C  SpO2: 100% 95%    Last Pain:  Vitals:   09/21/17 1328  PainSc: 2                  Zyrell Carmean S

## 2017-09-21 NOTE — Discharge Instructions (Signed)
°  General instructions:     Your recent bladder surgery requires very little post hospital care but some definite precautions.  Despite the fact that no skin incisions were used, the area around the bladder incisions are raw and covered with scabs to promote healing and prevent bleeding. Certain precautions are needed to insure that the scabs are not disturbed over the next 2-4 weeks while the healing proceeds.  Because the raw surface inside your bladder and the irritating effects of urine you may expect frequency of urination and/or urgency (a stronger desire to urinate) and perhaps even getting up at night more often. This will usually resolve or improve slowly over the healing period. You may see some blood in your urine over the first 6 weeks. Do not be alarmed, even if the urine was clear for a while. Get off your feet and drink lots of fluids until clearing occurs. If you start to pass clots or don't improve call us.  Diet:  You may return to your normal diet immediately. Because of the raw surface of your bladder, alcohol, spicy foods, foods high in acid and drinks with caffeine may cause irritation or frequency and should be used in moderation. To keep your urine flowing freely and avoid constipation, drink plenty of fluids during the day (8-10 glasses). Tip: Avoid cranberry juice because it is very acidic.  Activity:  Your physical activity doesn't need to be restricted. However, if you are very active, you may see some blood in the urine. We suggest that you reduce your activity under the circumstances until the bleeding has stopped.  Bowels:  It is important to keep your bowels regular during the postoperative period. Straining with bowel movements can cause bleeding. A bowel movement every other day is reasonable. Use a mild laxative if needed, such as milk of magnesia 2-3 tablespoons, or 2 Dulcolax tablets. Call if you continue to have problems. If you had been taking narcotics for  pain, before, during or after your surgery, you may be constipated. Take a laxative if necessary.    Medication:  You should resume your pre-surgery medications unless told not to. In addition you may be given an antibiotic to prevent or treat infection. Antibiotics are not always necessary. All medication should be taken as prescribed until the bottles are finished unless you are having an unusual reaction to one of the drugs.

## 2017-09-21 NOTE — H&P (Signed)
CC: I have prostate cancer.  HPI: [name] is a 77 year-old male established patient who is here evaluation for treatment of prostate cancer.  His prostate cancer was diagnosed 05/10/1916. His cancer was diagnosed by grapey. His PSA at his time of diagnosis was 29.5.   11/03/16  Elder Cyphers presented recently with a PSA 29.5. 3 years earlier. He had undergone a negative biopsy when his PSA was in the 8-9 range. Prostate without time was 73 g. He was to follow-up, but did not keep any follow-up appointments. The patient has also had significant voiding symptoms with a symptom score of 19. Rectal exam revealed some prostate asymmetry with what I felt was induration of the right lobe of the prostate. No obvious extension of the induration outside the prostate.   Biopsy was performed on April 16. Prostate volume was 93 g. There were some scattered hypoechoic areas, right greater than left. No obvious extension beyond the prostate. A broad-based middle lobe was noted. There did not appear to be a intravesical component of his EPH.   Biopsies were positive on the right side. All left-sided biopsies were negative. Positive biopsies on the right were 4 out of 6 cores. Involvement was at the base and midportion of the prostate. Primarily, Gleason's 4+4 = 8 cancer with 5-70 percent core involvement concentrated primarily at the prostatic base.   He has subsequently undergone additional staging studies with pelvic CT and bone scan. No evidence of metastatic disease was appreciated.   CT did not reveal any obvious local extension or pelvic adenopathy.  He started hormonal therapy in May 2018. He subsequently had marker seeds placed in June 2018.   03/11/17  The patient is taking Flomax with good results. He has nocturia 1 but otherwise is voiding well. He is tolerating the Lupron overall pretty well but does get some headaches hot flashes and joint pain which has improved some. On 03/04/2017, PSA was  0.107 and testosterone was less than 10. He is due for Lupron today.   07/22/2017:  PSA on 07/11/2017 was 0.06. He continues on Lupron. He is due for one next week. He did not want it to get it today. Voiding okay. In the interval, he did have an episode of gross hematuria that has since resolved. He also complains about intermittent left-sided testicular pain. No dysuria. This is not too bothersome. Urinalysis is negative today.   08/19/2017:  For hematuria workup, the patient underwent a CT IVP which revealed the following:  IMPRESSION:  1. 2.0 cm smooth oval-shaped to bladder stone.  2. Nonobstructive left nephrolithiasis.  3. Mild generalized wall thickening, although probably related to  nondistention. Cystitis is not readily excluded.  4. Mild hepatic steatosis.  5. Other imaging findings of potential clinical significance: Aortic  Atherosclerosis (ICD10-I70.0). Right coronary artery  atherosclerosis. Mild to moderate prostatomegaly. Fiducials along  the posterior margin of the prostate gland. Sigmoid colon  diverticulosis.  He presents today for cystoscopy.     ALLERGIES: Sulfa Drugs    MEDICATIONS: Tamsulosin Hcl 0.4 mg capsule 1 capsule PO Daily  Aspirin Ec 81 mg tablet, delayed release Oral  Clopidogrel Bisulfate 75 MG Oral Tablet Oral  Crestor TABS Oral  Daily Multiple Vitamins TABS Oral  Lisinopril 2.5 MG Oral Tablet Oral  Metformin Hcl 500 mg tablet Oral  Vitamin D TABS Oral     GU PSH: Locm 300-399Mg /Ml Iodine,1Ml - 08/01/2017, 05/28/2016 PLACE RT DEVICE/MARKER, PROS - 06/30/2016 Prostate Needle Biopsy - 05/10/2016  PSH Notes: Inguinal Hernia Repair, Tonsillectomy, Cath Stent Placement   NON-GU PSH: Remove Tonsils - 2010 Surgical Pathology, Gross And Microscopic Examination For Prostate Needle - 05/10/2016    GU PMH: Prostate Cancer - 07/22/2017, - 03/11/2017, - 11/03/2016, - 06/02/2016, - 05/19/2016 BPH w/LUTS - 05/19/2016, - 05/03/2016, Benign prostatic hyperplasia with  urinary obstruction, - 2015 Nocturia - 05/03/2016 Urinary Hesitancy - 05/03/2016 Weak Urinary Stream - 05/03/2016 Elevated PSA, Elevated prostate specific antigen (PSA) - 2015 Spermatocele of epididymis, Unspec, Spermatocele - 2015 Chronic prostatitis, Prostatitis, chronic - 2014 ED due to arterial insufficiency, Erectile dysfunction due to arterial insufficiency - 2014 Gross hematuria, Gross hematuria - 2014 Unil Inguinal Hernia W/O obst or gang,non-recurrent, Right Inguinal Hernia - 2014      PMH Notes:  Past Gu Hx:    [place] previously had been evaluated by Dr. Karsten Ro but chose to seek an evaluation by a new urologist. The patient had presented in 2010 with an increase in his PSA from 3.4 to 4.7. He had also had a couple of episodes of initial gross hematuria. The plan at that time was for him to come back for a renal ultrasound as well as potentially a cystoscopy and a reassessment of his PSA. The patient at that time chose not to come back for any follow-up. He had no further episodes of gross hematuria and it appears that he was never noted to have any additional microhematuria on reassessment. His PSA subsequently declined and in June of 2011 was back to 3.9, which was close to his baseline over the last several years. iN 2012 his PSA was noted to be 8.2 and he did agree to be reevaluated.    In essence, he has had a PSA that has waxed-and-waned. When I saw him in the spring of 2012, we ended up doing a urine for PCA3 testing which indicated low risk for prostate cancer. Urine culture was negative at that time. The patient was treated empirically with some antibiotics. The patient's PSA which had been as high as 8.2, decreased to 6.6. The patient had a PSA in August of 2012 which was down to 3.8, which was very encouraging.    Mr. Cobarrubias is status post prostate ultrasound and biopsy in May of 2014. His PSA jumped up to 9 ( 01/2012) and was reassessed in our office and found to be  approximately 8 ( 03/2012). PSA 2 was also quite low 11%. We suggest ultrasound and biopsy which he accepted as a recommendation. At the time of ultrasound the patient was noted to have moderate prostate enlargement with approximately a 73 g prostate. There were some inner prostatic cysts but nothing else worrisome. All biopsies fortunately were negative. He has had no obvious complications or problems status post the procedure.     NON-GU PMH: Encounter for general adult medical examination without abnormal findings, Encounter for preventive health examination - 2015 Personal history of other diseases of the circulatory system, History of cardiac disorder - 2014 Personal history of other endocrine, nutritional and metabolic disease, History of hypercholesterolemia - 2014, History of diabetes mellitus, - 2014 Diabetes Type 2 Hypercholesterolemia Hypertension    FAMILY HISTORY: Death In The Family Father - Runs In Family Death In The Family Mother - Runs In Family Family Health Status Number - Runs In Family   SOCIAL HISTORY: Marital Status: Married Preferred Language: English; Ethnicity: Not Hispanic Or Latino; Race: White Current Smoking Status: Patient does not smoke anymore. Has not smoked since 04/25/2001.  Tobacco Use Assessment Completed: Used Tobacco in last 30 days? Drinks 1 drink per day.  Drinks 2 caffeinated drinks per day.     Notes: Current every day smoker, Caffeine Use, Marital History - Currently Married, Tobacco Use, Alcohol Use, Occupation:   REVIEW OF SYSTEMS:    GU Review Male:   Patient denies frequent urination, hard to postpone urination, burning/ pain with urination, get up at night to urinate, leakage of urine, stream starts and stops, trouble starting your stream, have to strain to urinate , erection problems, and penile pain.  Gastrointestinal (Upper):   Patient denies nausea, vomiting, and indigestion/ heartburn.  Gastrointestinal (Lower):   Patient denies  diarrhea and constipation.  Constitutional:   Patient denies night sweats, fatigue, fever, and weight loss.  Skin:   Patient denies skin rash/ lesion and itching.  Eyes:   Patient denies blurred vision and double vision.  Ears/ Nose/ Throat:   Patient denies sore throat and sinus problems.  Hematologic/Lymphatic:   Patient denies swollen glands and easy bruising.  Cardiovascular:   Patient denies leg swelling and chest pains.  Respiratory:   Patient denies cough and shortness of breath.  Endocrine:   Patient denies excessive thirst.  Musculoskeletal:   Patient denies back pain and joint pain.  Neurological:   Patient denies headaches and dizziness.  Psychologic:   Patient denies depression and anxiety.   VITAL SIGNS:      08/19/2017 10:44 AM  BP 110/64 mmHg  Heart Rate 67 /min  Temperature 98.0 F / 36.6 C   GU PHYSICAL EXAMINATION:    Penis: Circumcised, no warts, no cracks. No dorsal Peyronie's plaques, no left corporal Peyronie's plaques, no right corporal Peyronie's plaques, no scarring, no warts. No balanitis, no meatal stenosis.   MULTI-SYSTEM PHYSICAL EXAMINATION:    Constitutional: Well-nourished. No physical deformities. Normally developed. Good grooming.  Respiratory: No labored breathing, no use of accessory muscles.   Cardiovascular: Normal temperature, adequate perfusion of extremities  Skin: No paleness, no jaundice  Neurologic / Psychiatric: Oriented to time, oriented to place, oriented to person. No depression, no anxiety, no agitation.  Gastrointestinal: No mass, no tenderness, no rigidity, non obese abdomen.  Eyes: Normal conjunctivae. Normal eyelids.  Musculoskeletal: Normal gait and station of head and neck.     PAST DATA REVIEWED:  Source Of History:  Patient  X-Ray Review: C.T. Abdomen/Pelvis: Reviewed Films. Reviewed Report. Discussed With Patient.     07/11/17 03/04/17 06/13/13 12/06/12 04/19/12 09/22/10 05/07/10  PSA  Total PSA 0.060 ng/mL 0.107 ng/mL  8.03  7.78  7.98  3.79  6.58   Free PSA   1.00  0.79  0.88   0.5   % Free PSA   12  10  11   8      07/11/17 03/04/17 03/04/17  Hormones  Testosterone, Total <10 ng/dL <10 ng/dL < 10 pg/dL    PROCEDURES:         Flexible Cystoscopy - 52000  Risks, benefits, and some of the potential complications of the procedure were discussed at length with the patient including infection, bleeding, voiding discomfort, urinary retention, fever, chills, sepsis, and others. All questions were answered. Informed consent was obtained. Antibiotic prophylaxis was given. Sterile technique and intraurethral analgesia were used.  Meatus:  Normal size. Normal location. Normal condition.  Urethra:  No strictures.  External Sphincter:  Normal.  Verumontanum:  Normal.  Prostate:  Obstructing. Moderate hyperplasia.  Bladder Neck:  Non-obstructing.  Ureteral Orifices:  Normal location. Normal  size. Normal shape.   Bladder:  No trabeculation. No tumors. Normal mucosa. 2 cm bladder stone      The lower urinary tract was carefully examined. The procedure was well-tolerated and without complications. Antibiotic instructions were given. Instructions were given to call the office immediately for bloody urine, difficulty urinating, urinary retention, painful or frequent urination, fever, chills, nausea, vomiting or other illness. The patient stated that he understood these instructions and would comply with them.         Urinalysis Dipstick Dipstick Cont'd  Color: Yellow Bilirubin: Neg mg/dL  Appearance: Clear Ketones: Neg mg/dL  Specific Gravity: 1.020 Blood: Neg ery/uL  pH: <=5.0 Protein: Trace mg/dL  Glucose: 1+ mg/dL Urobilinogen: 0.2 mg/dL    Nitrites: Neg    Leukocyte Esterase: Neg leu/uL    ASSESSMENT:      ICD-10 Details  1 GU:   Bladder Stone - N21.0   2   Prostate Cancer - C61    PLAN:           Document Letter(s):  Created for Patient: Clinical Summary         Notes:   Recommend cystolithotripsy.  He would like to proceed.        Next Appointment:      Next Appointment: 10/28/2017 03:30 PM    Appointment Type: Nurse Visit    Location: [facility] [phone]    Provider: [facility] N[facility]    Reason for Visit: lupron    Signed by Link Snuffer, III, M.D. on 08/19/17 at 11:34 AM (EDT)

## 2017-09-22 ENCOUNTER — Encounter (HOSPITAL_COMMUNITY): Payer: Self-pay | Admitting: Urology

## 2017-12-27 IMAGING — NM NM MISC PROCEDURE
3 series · 18 of 18 positions shown · non-contrast
Comparison: none

[Series 1: stress-sum-em_(id)_sa · 6.4mm · 6.40mm/px · 6 of 64 frames shown]
[frame 6/64]
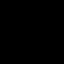
[frame 16/64]
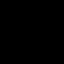
[frame 27/64]
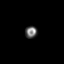
[frame 38/64]
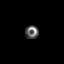
[frame 48/64]
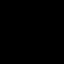
[frame 59/64]
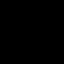

[Series 1: rest_(id)_sa · 6.4mm · 6.40mm/px · 6 of 64 frames shown]
[frame 6/64]
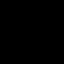
[frame 16/64]
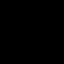
[frame 27/64]
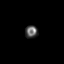
[frame 38/64]
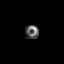
[frame 48/64]
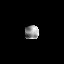
[frame 59/64]
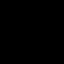

[Series 1: stress-gsp_(id)_sa · 6.4mm · 6.40mm/px · 6 of 512 frames shown]
[frame 43/512]
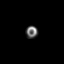
[frame 128/512]
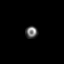
[frame 214/512]
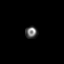
[frame 299/512]
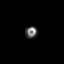
[frame 384/512]
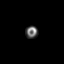
[frame 470/512]
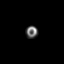

[18 of 18 positions shown; findings below may reference images not displayed]

Canned report from images found in remote index.

Refer to host system for actual result text.

## 2017-12-30 ENCOUNTER — Other Ambulatory Visit: Payer: Self-pay | Admitting: Cardiology

## 2017-12-30 DIAGNOSIS — I2510 Atherosclerotic heart disease of native coronary artery without angina pectoris: Secondary | ICD-10-CM

## 2017-12-30 MED ORDER — CLOPIDOGREL BISULFATE 75 MG PO TABS: 75.0000 mg | ORAL_TABLET | Freq: Every day | ORAL | 1 refills | Status: DC

## 2017-12-30 MED ORDER — ROSUVASTATIN CALCIUM 20 MG PO TABS: ORAL_TABLET | ORAL | 1 refills | Status: DC

## 2017-12-30 NOTE — Telephone Encounter (Signed)
Pt's medications were sent to pt's pharmacy as requested. Confirmation received.  

## 2017-12-30 NOTE — Telephone Encounter (Signed)
°*  STAT* If patient is at the pharmacy, call can be transferred to refill team.   1. Which medications need to be refilled? (please list name of each medication and dose if known) Pt says he need this medicine today please if possible- Clopidogrel and  Crestorastatin- pt has an appointment in January   2. which pharmacy/location (including street and city if local pharmacy) is medication to be sent to?[facility] [phone]  3. Do they need a 30 day or 90 day supply?  30 days supply

## 2018-02-09 ENCOUNTER — Ambulatory Visit: Payer: Medicare Other | Admitting: Cardiology

## 2018-02-10 ENCOUNTER — Encounter: Payer: Self-pay | Admitting: Cardiology

## 2018-03-21 ENCOUNTER — Ambulatory Visit: Payer: Medicare Other | Admitting: Cardiology

## 2018-04-11 ENCOUNTER — Telehealth: Payer: Self-pay

## 2018-04-11 DIAGNOSIS — I2510 Atherosclerotic heart disease of native coronary artery without angina pectoris: Secondary | ICD-10-CM

## 2018-04-11 MED ORDER — CLOPIDOGREL BISULFATE 75 MG PO TABS: 75.0000 mg | ORAL_TABLET | Freq: Every day | ORAL | 3 refills | Status: DC

## 2018-04-11 NOTE — Telephone Encounter (Signed)
I spoke to the patient (asymptomatic) and he was willing to reschedule.

## 2018-04-11 NOTE — Telephone Encounter (Signed)
-----   Message from Consuelo Pandy, [place] sent at 04/11/2018  2:56 PM EDT ----- Regarding: reschedule Ok to reschedule 3 months out if currently stable. This was to be his yearly f/u. Please refill cardiac meds, including Plavix to get him to his next visit.

## 2018-04-18 ENCOUNTER — Ambulatory Visit: Payer: Medicare Other | Admitting: Cardiology

## 2018-05-09 NOTE — Telephone Encounter (Signed)
   TELEPHONE CALL NOTE  This patient has been deemed a candidate for follow-up tele-health visit to limit community exposure during the Covid-19 pandemic. He was previously deemed a L3 to reschedule but these patients should now be transitioned to telehealth to stagger the volume and make sure patients are getting adequate care.  The patient did not answer so I LMOM to call us back. As of right now Dr. Radford Pax has availability on Thursday 4/16 if he returns call please get him on the schedule and go over information in [email or web address]- she is seeing every 30 minutes. For video visits her nurse states she is doing either doxy.me if smartphone or webex if computer.  Charlie Pitter, PA-C 05/09/2018 4:39 PM

## 2018-05-10 NOTE — Progress Notes (Deleted)
Virtual Visit via Video Note   This visit type was conducted due to national recommendations for restrictions regarding the COVID-19 Pandemic (e.g. social distancing) in an effort to limit this patient's exposure and mitigate transmission in our community.  Due to his co-morbid illnesses, this patient is at least at moderate risk for complications without adequate follow up.  This format is felt to be most appropriate for this patient at this time.  All issues noted in this document were discussed and addressed.  A limited physical exam was performed with this format.  Please refer to the patient's chart for his consent to telehealth for [facility].  Evaluation Performed:  Follow-up visit  This visit type was conducted due to national recommendations for restrictions regarding the COVID-19 Pandemic (e.g. social distancing).  This format is felt to be most appropriate for this patient at this time.  All issues noted in this document were discussed and addressed.  No physical exam was performed (except for noted visual exam findings with Video Visits).  Please refer to the patient's chart (MyChart message for video visits and phone note for telephone visits) for the patient's consent to telehealth for [facility].  Date:  05/10/2018   ID:  [name], DOB 07-Jul-1940, MRN [id number]  Patient Location:  Home  Provider location:   [place]  PCP:  Lujean Amel, MD  Cardiologist:  Fransico Him, MD  Electrophysiologist:  None   Chief Complaint:  CAD, hyperlipidemia  History of Present Illness:    [name] is a 78 y.o. male who presents via audio/video conferencing for a telehealth visit today.    [name] is a 78 y.o. male with a hx of ASCAD with one vessel CAD status post PCI of left circumflex and OM2 in 2009 and PCI of left circ 2010 for restenosis.  Stress Myoview 11/17 showed normal  EF 57% with no ischemia.  He also has a history of type 2 DM diet  controlled and hyperlipidemia. He is here today for followup and is doing well.  He denies any chest pain or pressure, SOB, DOE, PND, orthopnea, LE edema, dizziness, palpitations or syncope. He is compliant with his meds and is tolerating meds with no SE.    The patient does not have symptoms concerning for COVID-19 infection (fever, chills, cough, or new shortness of breath).    Prior CV studies:   The following studies were reviewed today:  none  Past Medical History:  Diagnosis Date  . CAD (coronary artery disease)    1 vessel CAD status post PCI of LCx and OM2 in 2009 and PCI of LCx 2010 for restenosis. Myoview 11/17: EF 57% with no ischemia  . Colon polyp 2006   repeat in 2 years  . COPD (chronic obstructive pulmonary disease) ([place])   . Diabetes mellitus without complication (HCC)    Diet-controlled non-insulin-dependent  . Erectile dysfunction   . History of tobacco abuse 04/2007   COPD on CXR  . Hyperlipidemia LDL goal <70   . Prostate cancer [facility])    Past Surgical History:  Procedure Laterality Date  . CATARACT EXTRACTION, BILATERAL  2 years ago  . CYSTOSCOPY WITH LITHOLAPAXY N/A 09/21/2017   Procedure: CYSTOSCOPY WITH LITHOLAPAXY;  Surgeon: Lucas Mallow, MD;  Location: [facility];  Service: Urology;  Laterality: N/A;  . PERCUTANEOUS CORONARY STENT INTERVENTION (PCI-S)  05/2007   PCI of LCx 03/2008 for stenosis above prior stent  . PROSTATE BIOPSY  No outpatient medications have been marked as taking for the 05/11/18 encounter (Appointment) with Sueanne Margarita, MD.     Allergies:   Sulfa antibiotics and Zocor [simvastatin]   Social History   Tobacco Use  . Smoking status: Former Smoker    Packs/day: 0.25    Years: 40.00    Pack years: 10.00    Types: Cigarettes    Last attempt to quit: 12/21/2003    Years since quitting: 14.3  . Smokeless tobacco: Never Used  Substance Use Topics  . Alcohol use: Yes    Comment: ocassional beer or glass of wine  . Drug  use: Yes    Types: Marijuana    Comment: last use 2 weeks      Family Hx: The patient's family history includes Arthritis in his mother; Cancer in his sister; Diabetes in his mother; Heart attack in his father.  ROS:   Please see the history of present illness.     All other systems reviewed and are negative.   Labs/Other Tests and Data Reviewed:    Recent Labs: 09/13/2017: BUN 14; Creatinine, Ser 0.76; Hemoglobin 14.0; Platelets 249; Potassium 4.6; Sodium 140   Recent Lipid Panel Lab Results  Component Value Date/Time   CHOL 111 02/26/2014 12:20 PM   TRIG 155 (H) 02/26/2014 12:20 PM   HDL 31 (L) 02/26/2014 12:20 PM   CHOLHDL 4.8 04/02/2008 06:10 AM   LDLCALC 49 02/26/2014 12:20 PM    Wt Readings from Last 3 Encounters:  09/21/17 142 lb (64.4 kg)  09/13/17 142 lb (64.4 kg)  03/17/17 154 lb 9.6 oz (70.1 kg)     Objective:    Vital Signs:  There were no vitals taken for this visit.   CONSTITUTIONAL:  Well nourished, well developed male in no acute distress.  EYES: anicteric MOUTH: oral mucosa is pink RESPIRATORY: Normal respiratory effort, symmetric expansion CARDIOVASCULAR: No peripheral edema SKIN: No rash, lesions or ulcers MUSCULOSKELETAL: no digital cyanosis NEURO: Cranial Nerves II-XII grossly intact, moves all extremities PSYCH: Intact judgement and insight.  A&O x 3, Mood/affect appropriate   ASSESSMENT & PLAN:    1.  ASCAD - cardiac cath showed one vessel CAD s/p PCI of left circumflex and OM2 in 2009 and PCI of left circ 2010 for restenosis.  Stress Myoview 11/17 showed normal  EF 57% with no ischemia. He has not had any anginal chest pain since I saw him last.  He will continue on ASA 81mg  daily, Plavix 75mg  daily, and statin.   2.  Hyperlipidemia - his LDL goal is < 70.  He wlil continue on Crestor 20mg  daily.    3.  Type 2 DM - this is followed by his PCP.  4.  COVID-19 Education:The signs and symptoms of COVID-19 were discussed with the patient and  how to seek care for testing (follow up with PCP or arrange E-visit).  The importance of social distancing was discussed today.  Patient Risk:   After full review of this patient's clinical status, I feel that they are at least moderate risk at this time.  Time:   Today, I have spent *** minutes with the patient reviewing chart and discussing medical problems including CAD, hyperlipidemia and reviewing symptoms of COVID 19 and the ways to protect against contracting the virus with telehealth technology.      Medication Adjustments/Labs and Tests Ordered: Current medicines are reviewed at length with the patient today.  Concerns regarding medicines are outlined above.  Tests Ordered:  No orders of the defined types were placed in this encounter.  Medication Changes: No orders of the defined types were placed in this encounter.   Disposition:  Follow up in 1 year(s)  Signed, Fransico Him, MD  05/10/2018 9:17 PM    [place]

## 2018-05-10 NOTE — Telephone Encounter (Signed)
I called and spoke with patient, he will do a telehealth visit tomorrow. Consent given over the phone and sent through my chart.

## 2018-05-11 ENCOUNTER — Other Ambulatory Visit: Payer: Self-pay

## 2018-05-11 ENCOUNTER — Telehealth: Payer: Medicare Other | Admitting: Cardiology

## 2018-05-15 ENCOUNTER — Other Ambulatory Visit: Payer: Self-pay

## 2018-05-15 ENCOUNTER — Telehealth (INDEPENDENT_AMBULATORY_CARE_PROVIDER_SITE_OTHER): Payer: Medicare Other | Admitting: Cardiology

## 2018-05-15 ENCOUNTER — Telehealth: Payer: Self-pay | Admitting: *Deleted

## 2018-05-15 VITALS — Ht 68.0 in | Wt 142.0 lb

## 2018-05-15 DIAGNOSIS — E1122 Type 2 diabetes mellitus with diabetic chronic kidney disease: Secondary | ICD-10-CM

## 2018-05-15 DIAGNOSIS — I2510 Atherosclerotic heart disease of native coronary artery without angina pectoris: Secondary | ICD-10-CM | POA: Diagnosis not present

## 2018-05-15 DIAGNOSIS — E7800 Pure hypercholesterolemia, unspecified: Secondary | ICD-10-CM | POA: Diagnosis not present

## 2018-05-15 DIAGNOSIS — Z7189 Other specified counseling: Secondary | ICD-10-CM | POA: Diagnosis not present

## 2018-05-15 DIAGNOSIS — N181 Chronic kidney disease, stage 1: Secondary | ICD-10-CM

## 2018-05-15 NOTE — Telephone Encounter (Signed)
SPOKE WITH PT AND PT CONSENT TO DOXY.ME    DOXIMITY or DOXY.ME - The patient will receive a link just prior to their visit, either by text or email (to be determined day of appointment depending on if it's doxy.me or Doximity).  Confirm consent - "In the setting of the current Covid19 crisis, you are scheduled for a (phone or video) visit with your provider on (date) at (time).  Just as we do with many in-office visits, in order for you to participate in this visit, we must obtain consent.  If you'd like, I can send this to your mychart (if signed up) or email for you to review.  Otherwise, I can obtain your verbal consent now.  All virtual visits are billed to your insurance company just like a normal visit would be.  By agreeing to a virtual visit, we'd like you to understand that the technology does not allow for your provider to perform an examination, and thus may limit your provider's ability to fully assess your condition. If your provider identifies any concerns that need to be evaluated in person, we will make arrangements to do so.  Finally, though the technology is pretty good, we cannot assure that it will always work on either your or our end, and in the setting of a video visit, we may have to convert it to a phone-only visit.  In either situation, we cannot ensure that we have a secure connection.  Are you willing to proceed?" STAFF: Did the patient verbally acknowledge consent to telehealth visit? Document YES/NO here YES    [name] has been deemed a candidate for a follow-up tele-health visit to limit community exposure during the Covid-19 pandemic. I spoke with the patient via phone to ensure availability of phone/video source, confirm preferred email & phone number, and discuss instructions and expectations.  I reminded [name] to be prepared with any vital sign and/or heart rhythm information that could potentially be obtained via home monitoring, at the time of  his visit. I reminded [name] to expect a phone call at the time of his visit if his visit.  [name], [place] 05/15/2018 9:14 AM      FULL LENGTH CONSENT FOR TELE-HEALTH VISIT   I hereby voluntarily request, consent and authorize [facility] and its employed or contracted physicians, physician assistants, nurse practitioners or other licensed health care professionals (the Practitioner), to provide me with telemedicine health care services (the Services") as deemed necessary by the treating Practitioner. I acknowledge and consent to receive the Services by the Practitioner via telemedicine. I understand that the telemedicine visit will involve communicating with the Practitioner through live audiovisual communication technology and the disclosure of certain medical information by electronic transmission. I acknowledge that I have been given the opportunity to request an in-person assessment or other available alternative prior to the telemedicine visit and am voluntarily participating in the telemedicine visit.  I understand that I have the right to withhold or withdraw my consent to the use of telemedicine in the course of my care at any time, without affecting my right to future care or treatment, and that the Practitioner or I may terminate the telemedicine visit at any time. I understand that I have the right to inspect all information obtained and/or recorded in the course of the telemedicine visit and may receive copies of available information for a reasonable fee.  I understand that some of the potential risks of receiving  the Services via telemedicine include:   Delay or interruption in medical evaluation due to technological equipment failure or disruption;  Information transmitted may not be sufficient (e.g. poor resolution of images) to allow for appropriate medical decision making by the Practitioner; and/or   In rare instances, security protocols could fail, causing  a breach of personal health information.  Furthermore, I acknowledge that it is my responsibility to provide information about my medical history, conditions and care that is complete and accurate to the best of my ability. I acknowledge that Practitioner's advice, recommendations, and/or decision may be based on factors not within their control, such as incomplete or inaccurate data provided by me or distortions of diagnostic images or specimens that may result from electronic transmissions. I understand that the practice of medicine is not an exact science and that Practitioner makes no warranties or guarantees regarding treatment outcomes. I acknowledge that I will receive a copy of this consent concurrently upon execution via email to the email address I last provided but may also request a printed copy by calling the office of [place].    I understand that my insurance will be billed for this visit.   I have read or had this consent read to me.  I understand the contents of this consent, which adequately explains the benefits and risks of the Services being provided via telemedicine.   I have been provided ample opportunity to ask questions regarding this consent and the Services and have had my questions answered to my satisfaction.  I give my informed consent for the services to be provided through the use of telemedicine in my medical care  By participating in this telemedicine visit I agree to the above.

## 2018-05-15 NOTE — Patient Instructions (Signed)
Medication Instructions:  Your physician recommends that you continue on your current medications as directed. Please refer to the Current Medication list given to you today.  If you need a refill on your cardiac medications before your next appointment, please call your pharmacy.   Lab work: Fasting Labs: 08/10/18, Lipid and Liver  If you have labs (blood work) drawn today and your tests are completely normal, you will receive your results only by: Marland Kitchen MyChart Message (if you have MyChart) OR . A paper copy in the mail If you have any lab test that is abnormal or we need to change your treatment, we will call you to review the results.  Testing/Procedures: None  Follow-Up: At [facility], you and your health needs are our priority.  As part of our continuing mission to provide you with exceptional heart care, we have created designated Provider Care Teams.  These Care Teams include your primary Cardiologist (physician) and Advanced Practice Providers (APPs -  Physician Assistants and Nurse Practitioners) who all work together to provide you with the care you need, when you need it. You will need a follow up appointment in 1 years.  Please call our office 2 months in advance to schedule this appointment.  You may see Fransico Him, MD or one of the following Advanced Practice Providers on your designated Care Team:   Zeb, PA-C Melina Copa, PA-C . Ermalinda Barrios, PA-C

## 2018-05-15 NOTE — Progress Notes (Signed)
Virtual Visit was attempted but his video is not working so this was changed to telephone Note   This visit type was conducted due to national recommendations for restrictions regarding the COVID-19 Pandemic (e.g. social distancing) in an effort to limit this patient's exposure and mitigate transmission in our community.  Due to his co-morbid illnesses, this patient is at least at moderate risk for complications without adequate follow up.  This format is felt to be most appropriate for this patient at this time.  All issues noted in this document were discussed and addressed.  A limited physical exam was performed with this format.  Please refer to the patient's chart for his consent to telehealth for [facility].  Evaluation Performed:  Follow-up visit  This visit type was conducted due to national recommendations for restrictions regarding the COVID-19 Pandemic (e.g. social distancing).  This format is felt to be most appropriate for this patient at this time.  All issues noted in this document were discussed and addressed.  No physical exam was performed (except for noted visual exam findings with Video Visits).  Please refer to the patient's chart (MyChart message for video visits and phone note for telephone visits) for the patient's consent to telehealth for [facility].  Date:  05/15/2018   ID:  [name], DOB 1940-07-01, MRN [id number]  Patient Location:  Home  Provider location:   [place]  PCP:  Lujean Amel, MD  Cardiologist:  Fransico Him, MD  Electrophysiologist:  None   Chief Complaint:  CAD, Hyperlipidemia, DM  History of Present Illness:    [name] is a 78 y.o. male who presents via audio/video conferencing for a telehealth visit today.    [name] is a 78 y.o. male with a hx of ASCAD with one vessel CAD status post PCI of left circumflex and OM2 in 2009 and PCI of left circ 2010 for restenosis.  Stress Myoview 11/17 showed normal  EF  57% with no ischemia.  He also has a history of type 2 DM diet controlled and hyperlipidemia. He is here today for followup and is doing well.  He denies any chest pain or pressure, SOB, DOE, PND, orthopnea,  dizziness, palpitations or syncope. He occasionally has some mild LE edema which has now improved.  He is compliant with his meds and is tolerating meds with no SE.    The patient does not have symptoms concerning for COVID-19 infection (fever, chills, cough, or new shortness of breath).   Prior CV studies:   The following studies were reviewed today:  none  Past Medical History:  Diagnosis Date  . CAD (coronary artery disease)    1 vessel CAD status post PCI of LCx and OM2 in 2009 and PCI of LCx 2010 for restenosis. Myoview 11/17: EF 57% with no ischemia  . Colon polyp 2006   repeat in 2 years  . COPD (chronic obstructive pulmonary disease) ([place])   . Diabetes mellitus without complication (HCC)    Diet-controlled non-insulin-dependent  . Erectile dysfunction   . History of tobacco abuse 04/2007   COPD on CXR  . Hyperlipidemia LDL goal <70   . Prostate cancer [facility])    Past Surgical History:  Procedure Laterality Date  . CATARACT EXTRACTION, BILATERAL  2 years ago  . CYSTOSCOPY WITH LITHOLAPAXY N/A 09/21/2017   Procedure: CYSTOSCOPY WITH LITHOLAPAXY;  Surgeon: Lucas Mallow, MD;  Location: [facility];  Service: Urology;  Laterality: N/A;  . PERCUTANEOUS  CORONARY STENT INTERVENTION (PCI-S)  05/2007   PCI of LCx 03/2008 for stenosis above prior stent  . PROSTATE BIOPSY       Current Meds  Medication Sig  . alum & mag hydroxide-simeth (MAALOX/MYLANTA) 200-200-20 MG/5ML suspension Take 15 mLs by mouth daily as needed for indigestion or heartburn.  Marland Kitchen aspirin EC 81 MG tablet Take 81 mg by mouth daily.  . Carboxymethylcellul-Glycerin (LUBRICATING EYE DROPS OP) Place 1 drop into both eyes daily as needed (dry eyes).  . clopidogrel (PLAVIX) 75 MG tablet Take 1 tablet (75 mg total) by  mouth daily. Please keep upcoming appt in January for future refills. Thank you  . glimepiride (AMARYL) 4 MG tablet Take 4 mg by mouth daily with breakfast.  . ibuprofen (ADVIL,MOTRIN) 200 MG tablet Take 200 mg by mouth 2 (two) times daily as needed for headache or moderate pain.  Marland Kitchen lisinopril (PRINIVIL,ZESTRIL) 2.5 MG tablet Take 2.5 mg by mouth every evening. Patient doesn't take this to manage BP. He takes this to prevent protein spillage.  . metFORMIN (GLUCOPHAGE) 500 MG tablet Take 1,000 mg by mouth 2 (two) times daily with a meal.  . NITROSTAT 0.4 MG SL tablet TAKE AS DIRECTED (Patient taking differently: Place 0.4 mg under the tongue every 5 (five) minutes as needed for chest pain. )  . rosuvastatin (CRESTOR) 20 MG tablet TAKE ONE TABLET EACH DAY. Please keep upcoming appt in January for future refills. Thank you  . sildenafil (REVATIO) 20 MG tablet Take 40-60 mg by mouth daily as needed (erectile dysfunction).  . tamsulosin (FLOMAX) 0.4 MG CAPS capsule Take 1 capsule (0.4 mg total) by mouth at bedtime.  . traMADol (ULTRAM) 50 MG tablet Take 1 tablet (50 mg total) by mouth every 6 (six) hours as needed.     Allergies:   Sulfa antibiotics and Zocor [simvastatin]   Social History   Tobacco Use  . Smoking status: Former Smoker    Packs/day: 0.25    Years: 40.00    Pack years: 10.00    Types: Cigarettes    Last attempt to quit: 12/21/2003    Years since quitting: 14.4  . Smokeless tobacco: Never Used  Substance Use Topics  . Alcohol use: Yes    Comment: ocassional beer or glass of wine  . Drug use: Yes    Types: Marijuana    Comment: last use 2 weeks      Family Hx: The patient's family history includes Arthritis in his mother; Cancer in his sister; Diabetes in his mother; Heart attack in his father.  ROS:   Please see the history of present illness.     All other systems reviewed and are negative.   Labs/Other Tests and Data Reviewed:    Recent Labs: 09/13/2017: BUN  14; Creatinine, Ser 0.76; Hemoglobin 14.0; Platelets 249; Potassium 4.6; Sodium 140   Recent Lipid Panel Lab Results  Component Value Date/Time   CHOL 111 02/26/2014 12:20 PM   TRIG 155 (H) 02/26/2014 12:20 PM   HDL 31 (L) 02/26/2014 12:20 PM   CHOLHDL 4.8 04/02/2008 06:10 AM   LDLCALC 49 02/26/2014 12:20 PM    Wt Readings from Last 3 Encounters:  05/15/18 142 lb (64.4 kg)  09/21/17 142 lb (64.4 kg)  09/13/17 142 lb (64.4 kg)     Objective:    Vital Signs:  Ht 5\' 8"  (1.727 m)   Wt 142 lb (64.4 kg)   BMI 21.59 kg/m    PE was not done since  patient could not get his video working on his phone and video was changed to telephone visit  ASSESSMENT & PLAN:    1.  ASCAD - 1 vessel CAD status post PCI of LCx and OM2 in 2009 and PCI of LCx 2010 for restenosis. Myoview 11/17: EF 57% with no ischemia.  He has not had any anginal sx since I saw him last.  He will continue on ASA 81mg  daily, Plavix 75mg  daily and statin.  2.  Hyperlipidemia - his LDL goal is < 70.  His last LDL was 57 a year ago.  He will continue on crestor 20mg  daily  Repeat FLP and ALT in July once COVID 19 crisis has improved.   3.  Type 2 DM - his HbA1C was 7.3 on 01/2018 and goal is < 7. This is followed by his PCP.  He will continue on  Metformin 1000mg  BID and Glimepiride 4mg  daily.  4. LE edema - this is very sporadic and likely related to dietary indiscretion with Sodium.  We discussed following a low sodium diet.  I asked him to let me know if LE edema starts to get worse.  4.  COVID-19 Education:The signs and symptoms of COVID-19 were discussed with the patient and how to seek care for testing (follow up with PCP or arrange E-visit).  The importance of social distancing was discussed today.  Patient Risk:   After full review of this patient's clinical status, I feel that they are at least moderate risk at this time.  Time:   Today, I have spent 20 minutes directly with the patient on video discussing medical  problems including CAD, hyperlipidemia, healthy diet and exercise and DM.  We also reviewed the symptoms of COVID 19 and the ways to protect against contracting the virus with telehealth technology.  I spent an additional 10 minutes reviewing patient's chart including recent lipids, renal function, TSH, HbA1C and prior echo.  Medication Adjustments/Labs and Tests Ordered: Current medicines are reviewed at length with the patient today.  Concerns regarding medicines are outlined above.  Tests Ordered: No orders of the defined types were placed in this encounter.  Medication Changes: No orders of the defined types were placed in this encounter.   Disposition:  Follow up in 1 year(s)  Signed, Fransico Him, MD  05/15/2018 11:16 AM    [place]

## 2018-08-10 ENCOUNTER — Other Ambulatory Visit: Payer: Medicare Other

## 2018-08-30 ENCOUNTER — Other Ambulatory Visit: Payer: Self-pay | Admitting: Cardiology

## 2019-03-28 ENCOUNTER — Encounter: Payer: Self-pay | Admitting: Registered"

## 2019-03-28 ENCOUNTER — Other Ambulatory Visit: Payer: Self-pay

## 2019-03-28 ENCOUNTER — Encounter: Payer: Medicare Other | Attending: Family Medicine | Admitting: Registered"

## 2019-03-28 DIAGNOSIS — E1169 Type 2 diabetes mellitus with other specified complication: Secondary | ICD-10-CM | POA: Insufficient documentation

## 2019-03-28 NOTE — Progress Notes (Signed)
Diabetes Self-Management Education  Visit Type: First/Initial  Appt. Start Time: 1410 Appt. End Time: [id number]  04/03/2019  Mr. [name], identified by name and date of birth, is a 79 y.o. male with a diagnosis of Diabetes: Type 2.   ASSESSMENT  Weight 148 lb 1.6 oz (67.2 kg). Body mass index is 22.52 kg/m.   Pt present with support person, [name], who lives with pt and prepares most of pt's foods. Pt reports he was around 150 lb a couple nights ago. Reports this morning he was 143 lb. Pt wants to be 140 lb. Pt reports he was monitoring his weight often because he thought it would be a good indicator to whether or not his blood sugar was staying within range. Pt reports he now sees that is not the case because while his weight has not increased, his last A1c was elevated. Pt reports he has not been checking his blood sugar regularly, but he is open to start monitoring. Reports he wants to get a Elenor Legato to make it easier. Reports his main barrier to checking more often is time/getting busy and forgetting to check regularly.  Pt wants to know if having a family history of diabetes makes it impossible to bring down your blood sugar.   Pt is unsure whether he has had a low but does report having times he has felt very tired. Reports also feeling very tired and falling asleep before when his blood sugar was very high (above 300).   Pt reports he doesn't usually eat until 9-10 AM, right before taking medications, however he often gets up much earlier.   Does drinks soy milk. Reports he does snack. Typical snacks: apple, orange, nuts (cashew, almonds, peanuts), low fat cottage cheese.  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   Diabetes Self-Management Education - 03/28/19 1416      Visit Information   Visit Type  First/Initial      Initial Visit   Diabetes Type  Type 2    Are you currently  following a meal plan?  Yes    What type of meal plan do you follow?  Pt reports he is mindful of intake/carbohydrates    Are you taking your medications as prescribed?  Yes    Date Diagnosed  ~10 years      Health Coping   How would you rate your overall health?  Good      Psychosocial Assessment   Patient Belief/Attitude about Diabetes  Motivated to manage diabetes    Self-care barriers  Hard of hearing    Self-management support  --   [name] present.   Other persons present  Spouse/SO;Patient    Patient Concerns  Nutrition/Meal planning;Healthy Lifestyle;Glycemic Control    Special Needs  None    Preferred Learning Style  No preference indicated    Learning Readiness  Ready    How often do you need to have someone help you when you read instructions, pamphlets, or other written materials from your doctor or pharmacy?  1 - Never    What is the last grade level you completed in school?  College      Pre-Education Assessment   Patient understands the diabetes disease and treatment process.  Needs Instruction    Patient understands incorporating nutritional management into lifestyle.  Needs Instruction    Patient undertands incorporating physical activity into lifestyle.  Demonstrates understanding / competency  Patient understands using medications safely.  Demonstrates understanding / competency    Patient understands monitoring blood glucose, interpreting and using results  Needs Instruction    Patient understands prevention, detection, and treatment of acute complications.  Needs Instruction    Patient understands prevention, detection, and treatment of chronic complications.  Needs Instruction    Patient understands how to develop strategies to address psychosocial issues.  Needs Instruction    Patient understands how to develop strategies to promote health/change behavior.  Needs Instruction      Complications   Last HgB A1C per patient/outside source  8.6 %    How often do  you check your blood sugar?  0 times/day (not testing)   Pt is not currently checking regularly but wishes to start.   Fasting Blood glucose range (mg/dL)  --   Pt is not currently checking blood sugar.   Postprandial Blood glucose range (mg/dL)  --   Pt is not currently checking blood sugar.   Number of hypoglycemic episodes per month  --   Pt unsure.   Number of hyperglycemic episodes per week  --   Pt unsure   Have you had a dilated eye exam in the past 12 months?  Yes    Have you had a dental exam in the past 12 months?  No    Are you checking your feet?  Yes    How many days per week are you checking your feet?  7      Dietary Intake   Breakfast  oatmeal (sometimes has one sausage/cheese ball made with Bisquik and milk OR yogurt with nuts), coffee with stevia, a little soymilk    Snack (morning)  usually has some type of snack    Lunch  usually leftovers-lentil soup, saltines, water    Snack (afternoon)  ~2 peanut butter crackers, nuts    Dinner  spaghetti and meatballs, sauteed onions and peppers, apple    Snack (evening)  chocolate croissant  quarter    Beverage(s)  water; coffee with stevia; sugar free cran-mango juice      Exercise   Exercise Type  Light (walking / raking leaves);ADL's    How many days per week to you exercise?  7    How many minutes per day do you exercise?  45    Total minutes per week of exercise  315      Patient Education   Previous Diabetes Education  Yes (please comment)   Pt reports attending a class on diabetes years ago but reports he did not learn very much   Disease state   Definition of diabetes, type 1 and 2, and the diagnosis of diabetes;Factors that contribute to the development of diabetes    Nutrition management   Role of diet in the treatment of diabetes and the relationship between the three main macronutrients and blood glucose level;Food label reading, portion sizes and measuring food.;Carbohydrate counting;Reviewed blood glucose goals  for pre and post meals and how to evaluate the patients' food intake on their blood glucose level.    Physical activity and exercise   Role of exercise on diabetes management, blood pressure control and cardiac health.    Monitoring  Purpose and frequency of SMBG.;Taught/discussed recording of test results and interpretation of SMBG.;Daily foot exams;Yearly dilated eye exam;Identified appropriate SMBG and/or A1C goals.;Interpreting lab values - A1C, lipid, urine microalbumina.    Acute complications  Taught treatment of hypoglycemia - the 15 rule.    Chronic complications  Relationship between chronic complications and blood glucose control;Assessed and discussed foot care and prevention of foot problems;Lipid levels, blood glucose control and heart disease;Retinopathy and reason for yearly dilated eye exams;Reviewed with patient heart disease, higher risk of, and prevention    Psychosocial adjustment  Role of stress on diabetes      Individualized Goals (developed by patient)   Nutrition  Follow meal plan discussed;General guidelines for healthy choices and portions discussed    Physical Activity  30 minutes per day;Exercise 3-5 times per week    Medications  take my medication as prescribed    Monitoring   test my blood glucose as discussed    Reducing Risk  treat hypoglycemia with 15 grams of carbs if blood glucose less than 70mg /dL      Post-Education Assessment   Patient understands the diabetes disease and treatment process.  Demonstrates understanding / competency    Patient understands incorporating nutritional management into lifestyle.  Demonstrates understanding / competency    Patient undertands incorporating physical activity into lifestyle.  Demonstrates understanding / competency    Patient understands using medications safely.  Demonstrates understanding / competency    Patient understands monitoring blood glucose, interpreting and using results  Demonstrates understanding /  competency    Patient understands prevention, detection, and treatment of acute complications.  Demonstrates understanding / competency    Patient understands prevention, detection, and treatment of chronic complications.  Demonstrates understanding / competency    Patient understands how to develop strategies to address psychosocial issues.  Demonstrates understanding / competency    Patient understands how to develop strategies to promote health/change behavior.  Demonstrates understanding / competency      Outcomes   Expected Outcomes  Demonstrated interest in learning. Expect positive outcomes    Future DMSE  3-4 months    Program Status  Completed       Individualized Plan for Diabetes Self-Management Training:   Learning Objective:  Patient will have a greater understanding of diabetes self-management. Patient education plan is to attend individual and/or group sessions per assessed needs and concerns.   Plan: -Dietitian provided DSME. Discussed role of genetics and environment on development of diabetes (both play a role) and that even with a family history, good nutrition and staying active can have a significant impact. Discussed importance of checking blood sugar regularly and how weight is not an indicator of blood sugar. Discussed that dietitian would not recommend pt losing more weight, but rather maintaining weight. Discussed carbohydrate counting and that consistency is more important than the specific number of carbohydrates set. Discussed that is meals seem too light or too heavy at starting number of 3-4 carbohydrate choices per meal, can adjust up or down.  Also discussed monitoring weight and if continues to go down, gradually increasing intake. Pt appeared agreeable to information/goals discussed.   Instructions/Goals:   Recommend including consistent amount of carbohydrate choices at each meal. Recommend starting with 3-4 carbohydrate choices per meal (45-60 grams) and  seeing if you feel hungry or start losing weight can go to 4-5 (60-75 grams) carbohydrate choices per meal. Consistency is key.   Eat every 3-5 hours.   Check blood sugar 2 times daily. Fasting first thing in the morning and 1-2 hours after a meal. Document these numbers to provide good information about how food is affecting your blood sugar.   If you feel off, check blood sugar and follow 15/15 rule if blood sugar is 70 or less.   Check "total  carbohydrates" on label to view carb amount.   Patient Instructions  Instructions/Goals:   Recommend including consistent amount of carbohydrate choices at each meal. Recommend starting with 3-4 carbohydrate choices per meal (45-60 grams) and seeing if you feel hungry or start losing weight can go to 4-5 (60-75 grams) carbohydrate choices per meal. Consistency is key.   Eat every 3-5 hours.   Check blood sugar 2 times daily. Fasting first thing in the morning and 1-2 hours after a meal. Document these numbers to provide good information about how food is affecting your blood sugar.   If you feel off, check blood sugar and follow 15/15 rule if blood sugar is 70 or less.   Check "total carbohydrates" on label to view carb amount.    Expected Outcomes:  Demonstrated interest in learning. Expect positive outcomes  Education material provided: ADA - How to Thrive: A Guide for Your Journey with Diabetes, Meal plan card, My Plate and Snack sheet, Meal Plan fold out  If problems or questions, patient to contact team via:  Phone and Email  Future DSME appointment: 3-4 months

## 2019-03-28 NOTE — Patient Instructions (Addendum)
Instructions/Goals:   Recommend including consistent amount of carbohydrate choices at each meal. Recommend starting with 3-4 carbohydrate choices per meal (45-60 grams) and seeing if you feel hungry or start losing weight can go to 4-5 (60-75 grams) carbohydrate choices per meal. Consistency is key.   Eat every 3-5 hours.   Check blood sugar 2 times daily. Fasting first thing in the morning and 1-2 hours after a meal. Document these numbers to provide good information about how food is affecting your blood sugar.   If you feel off, check blood sugar and follow 15/15 rule if blood sugar is 70 or less.   Check "total carbohydrates" on label to view carb amount.

## 2019-04-18 ENCOUNTER — Other Ambulatory Visit: Payer: Self-pay | Admitting: Cardiology

## 2019-05-02 ENCOUNTER — Other Ambulatory Visit: Payer: Self-pay | Admitting: Cardiology

## 2019-05-02 DIAGNOSIS — I2510 Atherosclerotic heart disease of native coronary artery without angina pectoris: Secondary | ICD-10-CM

## 2019-06-19 ENCOUNTER — Other Ambulatory Visit: Payer: Self-pay | Admitting: Cardiology

## 2019-06-27 ENCOUNTER — Ambulatory Visit: Payer: Medicare Other | Admitting: Registered"

## 2019-07-12 ENCOUNTER — Encounter: Payer: Self-pay | Admitting: *Deleted

## 2019-07-17 ENCOUNTER — Encounter: Payer: Self-pay | Admitting: Physician Assistant

## 2019-07-17 ENCOUNTER — Ambulatory Visit (INDEPENDENT_AMBULATORY_CARE_PROVIDER_SITE_OTHER): Payer: Medicare Other | Admitting: Physician Assistant

## 2019-07-17 ENCOUNTER — Other Ambulatory Visit: Payer: Self-pay

## 2019-07-17 DIAGNOSIS — L821 Other seborrheic keratosis: Secondary | ICD-10-CM

## 2019-07-17 DIAGNOSIS — L814 Other melanin hyperpigmentation: Secondary | ICD-10-CM | POA: Diagnosis not present

## 2019-07-17 DIAGNOSIS — Z1283 Encounter for screening for malignant neoplasm of skin: Secondary | ICD-10-CM | POA: Diagnosis not present

## 2019-07-17 DIAGNOSIS — D1801 Hemangioma of skin and subcutaneous tissue: Secondary | ICD-10-CM

## 2019-07-17 DIAGNOSIS — C44329 Squamous cell carcinoma of skin of other parts of face: Secondary | ICD-10-CM

## 2019-07-17 DIAGNOSIS — L578 Other skin changes due to chronic exposure to nonionizing radiation: Secondary | ICD-10-CM

## 2019-07-17 DIAGNOSIS — C44320 Squamous cell carcinoma of skin of unspecified parts of face: Secondary | ICD-10-CM

## 2019-07-17 DIAGNOSIS — C44629 Squamous cell carcinoma of skin of left upper limb, including shoulder: Secondary | ICD-10-CM | POA: Diagnosis not present

## 2019-07-17 DIAGNOSIS — D229 Melanocytic nevi, unspecified: Secondary | ICD-10-CM | POA: Diagnosis not present

## 2019-07-17 NOTE — Progress Notes (Addendum)
   Follow-Up Visit   Subjective  [name] is a 80 y.o. male who presents for the following: Annual Exam (left arm x 1 year- " growth" ).  The following portions of the chart were reviewed this encounter and updated as appropriate: Tobacco  Allergies  Meds  Problems  Med Hx  Surg Hx  Fam Hx      Objective  Well appearing patient in no apparent distress; mood and affect are within normal limits.  All skin waist up examined.  Objective  Left Forearm - Posterior: Volcano growth on pink base  Curet & cautery 1.3cm  Objective  Left Eyebrow: Volcano growth on pink base  Curet & cautery 1.4cm     Assessment & Plan  SCC (squamous cell carcinoma), arm, left Left Forearm - Posterior  Destruction of lesion Complexity: simple   Destruction method: electrodesiccation and curettage   Informed consent: discussed and consent obtained   Timeout:  patient name, date of birth, surgical site, and procedure verified Anesthesia: the lesion was anesthetized in a standard fashion   Anesthetic:  1% lidocaine w/ epinephrine 1-100,000 local infiltration Curettage performed in three different directions: Yes   Electrodesiccation performed over the curetted area: Yes   Curettage cycles:  3 Lesion length (cm):  1.3 Lesion width (cm):  1.3 Margin per side (cm):  0.1 Final wound size (cm):  1.5 Hemostasis achieved with:  aluminum chloride Outcome: patient tolerated procedure well with no complications   Post-procedure details: wound care instructions given    Specimen 1 - Surgical pathology Differential Diagnosis:KA Check Margins: No  SCC (squamous cell carcinoma), face Left Eyebrow  Destruction of lesion Complexity: simple   Destruction method: electrodesiccation and curettage   Informed consent: discussed and consent obtained   Timeout:  patient name, date of birth, surgical site, and procedure verified Anesthesia: the lesion was anesthetized in a standard fashion     Anesthetic:  1% lidocaine w/ epinephrine 1-100,000 local infiltration Curettage performed in three different directions: Yes   Electrodesiccation performed over the curetted area: Yes   Curettage cycles:  3 Margin per side (cm):  0.1 Final wound size (cm):  1.4 Hemostasis achieved with:  aluminum chloride Outcome: patient tolerated procedure well with no complications   Post-procedure details: wound care instructions given    Specimen 2 - Surgical pathology Differential Diagnosis: KA Check Margins: No Lentigines - Scattered tan macules - Discussed due to sun exposure - Benign, observe - Call for any changes  Seborrheic Keratoses - Stuck-on, waxy, tan-brown papules and plaques  - Discussed benign etiology and prognosis. - Observe - Call for any changes  Melanocytic Nevi - Tan-brown and/or pink-flesh-colored symmetric macules and papules - Benign appearing on exam today - Observation - Call clinic for new or changing moles - Recommend daily use of broad spectrum spf 30+ sunscreen to sun-exposed areas.   Hemangiomas - Red papules - Discussed benign nature - Observe - Call for any changes  Actinic Damage - diffuse scaly erythematous macules with underlying dyspigmentation - Recommend daily broad spectrum sunscreen SPF 30+ to sun-exposed areas, reapply every 2 hours as needed.  - Call for new or changing lesions.  Skin cancer screening performed today.

## 2019-07-17 NOTE — Patient Instructions (Signed)

## 2019-07-23 ENCOUNTER — Other Ambulatory Visit: Payer: Self-pay | Admitting: Cardiology

## 2019-07-24 ENCOUNTER — Telehealth: Payer: Self-pay | Admitting: *Deleted

## 2019-07-24 NOTE — Telephone Encounter (Signed)
-----   Message from Warren Danes, [place] sent at 07/23/2019 12:52 PM EDT ----- Results to pt.  It was treated at time of visit. He called me last Monday. Called in doxycycline 100 bid #20 to [facility] [employer].  Please check in on him.

## 2019-07-24 NOTE — Telephone Encounter (Signed)
Left patient a message to call back and give [place] an update.

## 2019-07-24 NOTE — Telephone Encounter (Signed)
Patient left message on office voice mail that everything is good.  The infection is under control  And on the way to recovery.  Patient will call if anything changes.  The Doxycycline definitely helped.  Thank you.

## 2019-08-10 ENCOUNTER — Other Ambulatory Visit: Payer: Self-pay | Admitting: Cardiology

## 2019-08-10 DIAGNOSIS — I2510 Atherosclerotic heart disease of native coronary artery without angina pectoris: Secondary | ICD-10-CM

## 2019-08-10 MED ORDER — CLOPIDOGREL BISULFATE 75 MG PO TABS: 75.0000 mg | ORAL_TABLET | Freq: Every day | ORAL | 0 refills | Status: DC

## 2019-08-10 MED ORDER — ROSUVASTATIN CALCIUM 20 MG PO TABS: ORAL_TABLET | ORAL | 1 refills | Status: DC

## 2019-08-10 NOTE — Telephone Encounter (Signed)
Follow Up  STAT* If patient is at the pharmacy, call can be transferred to refill team.   1. Which medications need to be refilled? (please list name of each medication and dose if known) rosuvastatin (CRESTOR) 20 MG tablet  2. Which pharmacy/location (including street and city if local pharmacy) is medication to be sent to? [facility] [place], [place] - [address]  3. Do they need a 30 day or 90 day supply? 90 day

## 2019-08-10 NOTE — Telephone Encounter (Signed)
New Message   *STAT* If patient is at the pharmacy, call can be transferred to refill team.   1. Which medications need to be refilled? (please list name of each medication and dose if known) clopidogrel (PLAVIX) 75 MG tablet  2. Which pharmacy/location (including street and city if local pharmacy) is medication to be sent to? [facility] [place], [place] - [address]  3. Do they need a 30 day or 90 day supply? 90 day

## 2019-08-10 NOTE — Telephone Encounter (Signed)
**Note De-Identified Dryden Tapley Obfuscation** Rosuvastatin e-scribed to [name] to refill for #30 with 1 refill. Did not grant 90 day as requested as the pt is over a year overdue for f/u.

## 2019-08-10 NOTE — Addendum Note (Signed)
Addended by: Carter Kitten D on: 08/10/2019 11:18 AM   Modules accepted: Orders

## 2019-09-26 ENCOUNTER — Ambulatory Visit: Payer: Medicare Other | Admitting: Cardiology

## 2019-09-26 ENCOUNTER — Encounter: Payer: Self-pay | Admitting: Cardiology

## 2019-09-26 ENCOUNTER — Other Ambulatory Visit: Payer: Self-pay

## 2019-09-26 VITALS — BP 100/60 | HR 63 | Ht 68.0 in | Wt 146.0 lb

## 2019-09-26 DIAGNOSIS — E7800 Pure hypercholesterolemia, unspecified: Secondary | ICD-10-CM | POA: Diagnosis not present

## 2019-09-26 DIAGNOSIS — I2510 Atherosclerotic heart disease of native coronary artery without angina pectoris: Secondary | ICD-10-CM | POA: Diagnosis not present

## 2019-09-26 DIAGNOSIS — E1122 Type 2 diabetes mellitus with diabetic chronic kidney disease: Secondary | ICD-10-CM

## 2019-09-26 DIAGNOSIS — N181 Chronic kidney disease, stage 1: Secondary | ICD-10-CM

## 2019-09-26 DIAGNOSIS — R600 Localized edema: Secondary | ICD-10-CM

## 2019-09-26 NOTE — Patient Instructions (Signed)

## 2019-09-26 NOTE — Progress Notes (Signed)
Date:  09/26/2019   ID:  [name], DOB July 27, 1940, MRN [id number]   PCP:  Lujean Amel, MD  Cardiologist:  Fransico Him, MD  Electrophysiologist:  None   Chief Complaint:  CAD, Hyperlipidemia, DM  History of Present Illness:    [name] is a 79 y.o. male with a hx of ASCAD with one vessel CAD status post PCI of left circumflex and OM2 in 2009 and PCI of left circ 2010 for restenosis.  Stress Myoview 11/17 showed normal  EF 57% with no ischemia.  He also has a history of type 2 DM diet controlled and hyperlipidemia.   he is here today for followup and is doing well.  He denies any chest pain or pressure, SOB, DOE, PND, orthopnea, LE edema, dizziness, or syncope. He had had some skipping of his heart beat but he cut out caffeine and that has resolved.  He is compliant with his meds and is tolerating meds with no SE.    Prior CV studies:   The following studies were reviewed today:  EKG  Past Medical History:  Diagnosis Date  . Atypical mole 04/18/2002   slight to moderate mid back   . Atypical mole 05/02/2007   right inner lower leg (moderate)  . Basal cell carcinoma 08/06/2008   left ear (mohs)  . BCC (basal cell carcinoma) 08/06/2008   right anterior sholder (cx93fu)   . BCC (basal cell carcinoma) 03/14/2009   left post neck (clear)  . BCC (basal cell carcinoma) 11/20/2013   right post sholder (cx72fu)  . BCC (basal cell carcinoma) 02/23/2017   mid chest (cx70fu)   . CAD (coronary artery disease)    1 vessel CAD status post PCI of LCx and OM2 in 2009 and PCI of LCx 2010 for restenosis. Myoview 11/17: EF 57% with no ischemia  . Colon polyp 2006   repeat in 2 years  . COPD (chronic obstructive pulmonary disease) ([place])   . Diabetes mellitus without complication (HCC)    Diet-controlled non-insulin-dependent  . Erectile dysfunction   . History of tobacco abuse 04/2007   COPD on CXR  . Hyperlipidemia LDL goal <70   . Prostate cancer [facility])    Past  Surgical History:  Procedure Laterality Date  . CATARACT EXTRACTION, BILATERAL  2 years ago  . CYSTOSCOPY WITH LITHOLAPAXY N/A 09/21/2017   Procedure: CYSTOSCOPY WITH LITHOLAPAXY;  Surgeon: Lucas Mallow, MD;  Location: [facility];  Service: Urology;  Laterality: N/A;  . PERCUTANEOUS CORONARY STENT INTERVENTION (PCI-S)  05/2007   PCI of LCx 03/2008 for stenosis above prior stent  . PROSTATE BIOPSY       Current Meds  Medication Sig  . alum & mag hydroxide-simeth (MAALOX/MYLANTA) 200-200-20 MG/5ML suspension Take 15 mLs by mouth daily as needed for indigestion or heartburn.  Marland Kitchen aspirin EC 81 MG tablet Take 81 mg by mouth daily.  . Carboxymethylcellul-Glycerin (LUBRICATING EYE DROPS OP) Place 1 drop into both eyes daily as needed (dry eyes).  . clopidogrel (PLAVIX) 75 MG tablet Take 1 tablet (75 mg total) by mouth daily. Please keep upcoming appt in September with Dr. Radford Pax before anymore refills. Thank you  . glimepiride (AMARYL) 4 MG tablet Take 4 mg by mouth daily with breakfast.  . ibuprofen (ADVIL,MOTRIN) 200 MG tablet Take 200 mg by mouth 2 (two) times daily as needed for headache or moderate pain.  Marland Kitchen JARDIANCE 10 MG TABS tablet Take 10 mg by mouth daily.  Marland Kitchen lisinopril (  PRINIVIL,ZESTRIL) 2.5 MG tablet Take 2.5 mg by mouth every evening. Patient doesn't take this to manage BP. He takes this to prevent protein spillage.  . metFORMIN (GLUCOPHAGE) 500 MG tablet Take 1,000 mg by mouth 2 (two) times daily with a meal.  . nitroGLYCERIN (NITROSTAT) 0.4 MG SL tablet Place 1 tablet (0.4 mg total) under the tongue every 5 (five) minutes as needed. Please make yearly appt with Dr. Radford Pax for April. 1st attempt  . rosuvastatin (CRESTOR) 20 MG tablet MUST KEEP APPT ON 09/26/19 TO CONT REFILLS. Thank you.  . sildenafil (REVATIO) 20 MG tablet Take 40-60 mg by mouth daily as needed (erectile dysfunction).  . tamsulosin (FLOMAX) 0.4 MG CAPS capsule Take 1 capsule (0.4 mg total) by mouth at bedtime.      Allergies:   Sulfa antibiotics and Zocor [simvastatin]   Social History   Tobacco Use  . Smoking status: Former Smoker    Packs/day: 0.25    Years: 40.00    Pack years: 10.00    Types: Cigarettes    Quit date: 12/21/2003    Years since quitting: 15.7  . Smokeless tobacco: Never Used  Vaping Use  . Vaping Use: Never used  Substance Use Topics  . Alcohol use: Yes    Comment: ocassional beer or glass of wine  . Drug use: Yes    Types: Marijuana    Comment: last use 2 weeks      Family Hx: The patient's family history includes Arthritis in his mother; Cancer in his sister; Diabetes in his mother; Heart attack in his father.  ROS:   Please see the history of present illness.     All other systems reviewed and are negative.   Labs/Other Tests and Data Reviewed:    Recent Labs: No results found for requested labs within last 8760 hours.   Recent Lipid Panel Lab Results  Component Value Date/Time   CHOL 111 02/26/2014 12:20 PM   TRIG 155 (H) 02/26/2014 12:20 PM   HDL 31 (L) 02/26/2014 12:20 PM   CHOLHDL 4.8 04/02/2008 06:10 AM   LDLCALC 49 02/26/2014 12:20 PM    Wt Readings from Last 3 Encounters:  09/26/19 146 lb (66.2 kg)  03/28/19 148 lb 1.6 oz (67.2 kg)  05/15/18 142 lb (64.4 kg)     Objective:    Vital Signs:  BP 100/60   Pulse 63   Ht 5\' 8"  (1.727 m)   Wt 146 lb (66.2 kg)   SpO2 96%   BMI 22.20 kg/m    GEN: Well nourished, well developed in no acute distress HEENT: Normal NECK: No JVD; No carotid bruits LYMPHATICS: No lymphadenopathy CARDIAC:RRR, no murmurs, rubs, gallops RESPIRATORY:  Clear to auscultation without rales, wheezing or rhonchi  ABDOMEN: Soft, non-tender, non-distended MUSCULOSKELETAL:  Trace LE edema; No deformity  SKIN: Warm and dry NEUROLOGIC:  Alert and oriented x 3 PSYCHIATRIC:  Normal affect   EKG was done in office and showed NSR with no ST changes and IRBBB  ASSESSMENT & PLAN:    1.  ASCAD  -1 vessel CAD s/p PCI  of LCx and OM2 in 2009 and PCI of LCx 2010 for restenosis.  -Myoview 11/17: EF 57% with no ischemia.   -he denies any anginal sx -continue on ASA, Plavix 75mg  daily, statin  2.  Hyperlipidemia  - his LDL goal is < 70.   -LDL was 51 in April 2021 -continue Crestor 20mg  daily  3.  Type 2 DM  -HbA1C 7.3%  in April 20 -continue on  Metformin 1000mg  BID, jardiance 10mg  daily and Glimepiride 4mg  daily. -continue ACE I and statin  4.  LE edema -trace on exam -likely related to dietary indiscretion with Na -he eats at [employer] for breakfast -Encouraged him to follow < 2gm Na diet    Medication Adjustments/Labs and Tests Ordered: Current medicines are reviewed at length with the patient today.  Concerns regarding medicines are outlined above.  Tests Ordered: Orders Placed This Encounter  Procedures  . EKG 12-Lead   Medication Changes: No orders of the defined types were placed in this encounter.   Disposition:  Follow up in 1 year(s)  Signed, Fransico Him, MD  09/26/2019 10:11 AM    [facility]

## 2019-10-17 ENCOUNTER — Other Ambulatory Visit: Payer: Self-pay | Admitting: Cardiology

## 2019-12-11 ENCOUNTER — Encounter: Payer: Self-pay | Admitting: Dermatology

## 2019-12-11 ENCOUNTER — Ambulatory Visit: Payer: Medicare Other | Admitting: Dermatology

## 2019-12-11 ENCOUNTER — Other Ambulatory Visit: Payer: Self-pay

## 2019-12-11 DIAGNOSIS — D485 Neoplasm of uncertain behavior of skin: Secondary | ICD-10-CM

## 2019-12-11 DIAGNOSIS — Z85828 Personal history of other malignant neoplasm of skin: Secondary | ICD-10-CM | POA: Diagnosis not present

## 2019-12-11 DIAGNOSIS — C4492 Squamous cell carcinoma of skin, unspecified: Secondary | ICD-10-CM

## 2019-12-11 DIAGNOSIS — C44329 Squamous cell carcinoma of skin of other parts of face: Secondary | ICD-10-CM | POA: Diagnosis not present

## 2019-12-11 DIAGNOSIS — C44320 Squamous cell carcinoma of skin of unspecified parts of face: Secondary | ICD-10-CM

## 2019-12-11 HISTORY — DX: Squamous cell carcinoma of skin, unspecified: C44.92

## 2019-12-11 NOTE — Patient Instructions (Signed)

## 2019-12-11 NOTE — Progress Notes (Addendum)
   Follow-Up Visit   Subjective  [name] is a 79 y.o. male who presents for the following: Skin Problem (left eyebrow- bx'd & treated in june- spot back).  Recurrent growth Location: Left outer eyebrow Duration:  Quality: Previous biopsy equals SCCA, based treated at time of biopsy but has regrown Associated Signs/Symptoms: Modifying Factors:  Severity:  Timing: Context:   Objective  Well appearing patient in no apparent distress; mood and affect are within normal limits.  A focused examination was performed including Head and neck.  Regional lymph nodes.. Relevant physical exam findings are noted in the Assessment and Plan.   Assessment & Plan    History of basal cell cancer Chest - Medial (Center)  SCC (squamous cell carcinoma), face Left Eyebrow  Skin / nail biopsy Type of biopsy: tangential   Informed consent: discussed and consent obtained   Timeout: patient name, date of birth, surgical site, and procedure verified   Procedure prep:  Patient was prepped and draped in usual sterile fashion (Non sterile) Prep type:  Chlorhexidine Anesthesia: the lesion was anesthetized in a standard fashion   Anesthetic:  1% lidocaine w/ epinephrine 1-100,000 local infiltration Instrument used: flexible razor blade   Outcome: patient tolerated procedure well   Post-procedure details: wound care instructions given    Specimen 1 - Surgical pathology Differential Diagnosis: KA Check Margins: No     I, Lavonna Monarch, MD, have reviewed all documentation for this visit.  The documentation on 12/14/19 for the exam, diagnosis, procedures, and orders are all accurate and complete.

## 2019-12-14 NOTE — Addendum Note (Signed)
Addended by: Robyne Askew R on: 12/14/2019 03:29 PM   Modules accepted: Level of Service

## 2019-12-18 ENCOUNTER — Telehealth: Payer: Self-pay | Admitting: Dermatology

## 2019-12-18 NOTE — Telephone Encounter (Signed)
Patient is calling to say that he is ready to schedule Moh's surgery and would like a return call when this is done.

## 2019-12-19 NOTE — Telephone Encounter (Signed)
Phone call from patient returning my call regarding scheduling patient for [facility].  I informed patient that once Dr. Denna Haggard reviews his pathology results and gives [place] the next step we will let the patient know. Patient aware.

## 2019-12-19 NOTE — Telephone Encounter (Signed)
Phone call to patient regarding scheduling him for [facility].  Voicemail left for patient to give the office a call back.

## 2020-01-07 ENCOUNTER — Telehealth: Payer: Self-pay | Admitting: *Deleted

## 2020-01-07 NOTE — Telephone Encounter (Signed)
I called patientand left message to let him know that I sent the referral to [facility].  I called [facility] and Dr Anne Fu and neither could schedule by January.  So I sent referral to [facility].  Left message for patient to call back with any question or if he hasn't heard about appointment within the week.

## 2020-01-31 DIAGNOSIS — C44329 Squamous cell carcinoma of skin of other parts of face: Secondary | ICD-10-CM | POA: Diagnosis not present

## 2020-02-12 ENCOUNTER — Ambulatory Visit: Payer: Medicare Other | Admitting: Physician Assistant

## 2020-02-19 DIAGNOSIS — C44329 Squamous cell carcinoma of skin of other parts of face: Secondary | ICD-10-CM | POA: Diagnosis not present

## 2020-03-31 ENCOUNTER — Encounter: Payer: Self-pay | Admitting: *Deleted

## 2020-04-30 ENCOUNTER — Other Ambulatory Visit: Payer: Self-pay | Admitting: Cardiology

## 2020-05-20 DIAGNOSIS — E119 Type 2 diabetes mellitus without complications: Secondary | ICD-10-CM | POA: Diagnosis not present

## 2020-06-04 DIAGNOSIS — E119 Type 2 diabetes mellitus without complications: Secondary | ICD-10-CM | POA: Diagnosis not present

## 2020-08-14 DIAGNOSIS — H11122 Conjunctival concretions, left eye: Secondary | ICD-10-CM | POA: Diagnosis not present

## 2020-08-20 ENCOUNTER — Other Ambulatory Visit: Payer: Self-pay | Admitting: Cardiology

## 2020-08-20 DIAGNOSIS — I2510 Atherosclerotic heart disease of native coronary artery without angina pectoris: Secondary | ICD-10-CM

## 2020-08-29 DIAGNOSIS — C4441 Basal cell carcinoma of skin of scalp and neck: Secondary | ICD-10-CM | POA: Diagnosis not present

## 2020-08-29 DIAGNOSIS — L308 Other specified dermatitis: Secondary | ICD-10-CM | POA: Diagnosis not present

## 2020-08-29 DIAGNOSIS — D485 Neoplasm of uncertain behavior of skin: Secondary | ICD-10-CM | POA: Diagnosis not present

## 2020-08-29 DIAGNOSIS — Z85828 Personal history of other malignant neoplasm of skin: Secondary | ICD-10-CM | POA: Diagnosis not present

## 2020-08-29 DIAGNOSIS — L905 Scar conditions and fibrosis of skin: Secondary | ICD-10-CM | POA: Diagnosis not present

## 2020-08-29 DIAGNOSIS — L570 Actinic keratosis: Secondary | ICD-10-CM | POA: Diagnosis not present

## 2020-09-02 ENCOUNTER — Telehealth: Payer: Self-pay | Admitting: Cardiology

## 2020-09-02 NOTE — Telephone Encounter (Signed)
Spoke with the patient who states that this morning he had a sudden onset of nausea and ended up vomiting. He reports that he was lightheaded and felt that his heart rate was erratic. He states that his heart rate was in the 90s which is unusually high for him. He states that it felt like his heart was skipping beats. Denies any associated chest pain or shortness of breath.  Patient states that his heart rate and rhythm have normalized. He is feeling better now. Encouraged patient to stay hydrated and eat a small bland snack if he could tolerate it.

## 2020-09-02 NOTE — Telephone Encounter (Signed)
   STAT if HR is under 50 or over 120 (normal HR is 60-100 beats per minute)  What is your heart rate? 90  Do you have a log of your heart rate readings (document readings)?   Do you have any other symptoms? Pt said he's not been feeling good, he throw up this morning and very nauseous, he also mention that his HR is elevated and erratic. Denied CP, he wanted to be seen today at the office and doesn't want to go to ED. He also said even just to get an EKG in our office today is fine

## 2020-09-04 DIAGNOSIS — H11123 Conjunctival concretions, bilateral: Secondary | ICD-10-CM | POA: Diagnosis not present

## 2020-09-09 NOTE — Telephone Encounter (Signed)
Spoke with the patient who states that since last week he has felt better until this morning. He has another episode where he felt like his heart was out of rhythm. He denies any nausea or vomiting. He states that he felt uncomfortable and had trouble catching his breath. He states he was slightly lightheaded. He is coming in tomorrow 8/17 for an EKG.

## 2020-09-10 ENCOUNTER — Ambulatory Visit (INDEPENDENT_AMBULATORY_CARE_PROVIDER_SITE_OTHER): Payer: Medicare Other

## 2020-09-10 ENCOUNTER — Other Ambulatory Visit: Payer: Self-pay

## 2020-09-10 VITALS — BP 130/64 | HR 65 | Resp 13

## 2020-09-10 DIAGNOSIS — I2510 Atherosclerotic heart disease of native coronary artery without angina pectoris: Secondary | ICD-10-CM | POA: Diagnosis not present

## 2020-09-10 DIAGNOSIS — I499 Cardiac arrhythmia, unspecified: Secondary | ICD-10-CM | POA: Diagnosis not present

## 2020-09-10 NOTE — Progress Notes (Signed)
Reason for visit: EKG per Dr. Radford Pax for pt c/o :"erratic" heart rate and heart beat   Name of MD requesting visit: Dr. Fransico Him  H&P: H/O CAD PTCA 2009, 2010  ROS related to problem:   Assessment and plan per MD: Pt reports erratic heart beat 3 days ago when he had some mild nausea and some vomiting but has now subsided...  he woke up yesterday morning with "erratic/ irregular" heart beat for about an hour yesterday. No Nausea, no dizziness, no chest pain. He says he has had hit several days ago as well typically in the morning  and lasting for about an hour or so but fine with his ADL's the rest of the day.   Per the DOD, Dr Rayann Heman..... EKG done today per Dr. Theodosia Blender order, reveals NSR with rate 64.   Pt advised that I will forward to Dr. Radford Pax and possibly a monitor... he says it would have to be lengthy due to it happening seldom and several days in between.   He will continue to monitor and will call or call EMS if anything worsens or changes.

## 2020-09-10 NOTE — Patient Instructions (Signed)
Medication Instructions:  Your physician recommends that you continue on your current medications as directed. Please refer to the Current Medication list given to you today.  *If you need a refill on your cardiac medications before your next appointment, please call your pharmacy*   Lab Work: none If you have labs (blood work) drawn today and your tests are completely normal, you will receive your results only by: MyChart Message (if you have MyChart) OR A paper copy in the mail If you have any lab test that is abnormal or we need to change your treatment, we will call you to review the results.   Testing/Procedures: none   Follow-Up: At [facility], you and your health needs are our priority.  As part of our continuing mission to provide you with exceptional heart care, we have created designated Provider Care Teams.  These Care Teams include your primary Cardiologist (physician) and Advanced Practice Providers (APPs -  Physician Assistants and Nurse Practitioners) who all work together to provide you with the care you need, when you need it.  We recommend signing up for the patient portal called "MyChart".  Sign up information is provided on this After Visit Summary.  MyChart is used to connect with patients for Virtual Visits (Telemedicine).  Patients are able to view lab/test results, encounter notes, upcoming appointments, etc.  Non-urgent messages can be sent to your provider as well.   To learn more about what you can do with MyChart, go to [email or web address]://[email or web address].    

## 2020-09-11 DIAGNOSIS — I2510 Atherosclerotic heart disease of native coronary artery without angina pectoris: Secondary | ICD-10-CM | POA: Diagnosis not present

## 2020-09-11 DIAGNOSIS — Z79899 Other long term (current) drug therapy: Secondary | ICD-10-CM | POA: Diagnosis not present

## 2020-09-11 DIAGNOSIS — E7800 Pure hypercholesterolemia, unspecified: Secondary | ICD-10-CM | POA: Diagnosis not present

## 2020-09-11 DIAGNOSIS — E1169 Type 2 diabetes mellitus with other specified complication: Secondary | ICD-10-CM | POA: Diagnosis not present

## 2020-10-02 DIAGNOSIS — E230 Hypopituitarism: Secondary | ICD-10-CM | POA: Diagnosis not present

## 2020-10-07 DIAGNOSIS — J069 Acute upper respiratory infection, unspecified: Secondary | ICD-10-CM | POA: Diagnosis not present

## 2020-10-07 DIAGNOSIS — R509 Fever, unspecified: Secondary | ICD-10-CM | POA: Diagnosis not present

## 2020-10-07 DIAGNOSIS — U071 COVID-19: Secondary | ICD-10-CM | POA: Diagnosis not present

## 2020-10-07 DIAGNOSIS — Z20822 Contact with and (suspected) exposure to covid-19: Secondary | ICD-10-CM | POA: Diagnosis not present

## 2020-10-07 DIAGNOSIS — R051 Acute cough: Secondary | ICD-10-CM | POA: Diagnosis not present

## 2020-10-09 ENCOUNTER — Other Ambulatory Visit: Payer: Self-pay | Admitting: Cardiology

## 2020-10-15 ENCOUNTER — Ambulatory Visit: Payer: Medicare Other | Admitting: Physician Assistant

## 2020-10-15 ENCOUNTER — Encounter: Payer: Self-pay | Admitting: Physician Assistant

## 2020-10-15 ENCOUNTER — Other Ambulatory Visit: Payer: Self-pay

## 2020-10-15 DIAGNOSIS — Z808 Family history of malignant neoplasm of other organs or systems: Secondary | ICD-10-CM

## 2020-10-15 DIAGNOSIS — Z85828 Personal history of other malignant neoplasm of skin: Secondary | ICD-10-CM

## 2020-10-15 DIAGNOSIS — Z1283 Encounter for screening for malignant neoplasm of skin: Secondary | ICD-10-CM

## 2020-10-15 DIAGNOSIS — L219 Seborrheic dermatitis, unspecified: Secondary | ICD-10-CM

## 2020-10-15 DIAGNOSIS — B078 Other viral warts: Secondary | ICD-10-CM

## 2020-10-15 DIAGNOSIS — Z86018 Personal history of other benign neoplasm: Secondary | ICD-10-CM | POA: Diagnosis not present

## 2020-10-15 MED ORDER — KETOCONAZOLE 2 % EX SHAM: MEDICATED_SHAMPOO | CUTANEOUS | 8 refills | Status: DC

## 2020-10-15 NOTE — Progress Notes (Signed)
   Follow-Up Visit   Subjective  [name] is a 80 y.o. male who presents for the following: Annual Exam (New lesions around neck- Personal history of non mole skin cancer & atypical nevi, no melanoma. Family history of melanoma. ).   The following portions of the chart were reviewed this encounter and updated as appropriate:  Tobacco  Allergies  Meds  Problems  Med Hx  Surg Hx  Fam Hx      Objective  Well appearing patient in no apparent distress; mood and affect are within normal limits.  All skin waist up examined.  Mid Parietal Scalp Thin scaly plaques.  Neck - Anterior Verrucous papules -- Discussed viral etiology and contagion.    Assessment & Plan  Seborrheic dermatitis Mid Parietal Scalp  ketoconazole (NIZORAL) 2 % shampoo - Mid Parietal Scalp Apply to scalp- let sit 3-5 minutes before rinsing  Other viral warts Neck - Anterior  Destruction of lesion - Neck - Anterior Complexity: simple   Destruction method: cryotherapy   Informed consent: discussed and consent obtained   Timeout:  patient name, date of birth, surgical site, and procedure verified Lesion destroyed using liquid nitrogen: Yes   Cryotherapy cycles:  3 Outcome: patient tolerated procedure well with no complications      I, Juelz Claar, PA-C, have reviewed all documentation's for this visit.  The documentation on 10/15/20 for the exam, diagnosis, procedures and orders are all accurate and complete.

## 2020-10-27 ENCOUNTER — Telehealth: Payer: Self-pay

## 2020-10-27 NOTE — Telephone Encounter (Signed)
-----   Message from Sueanne Margarita, MD sent at 09/14/2020  6:27 PM EDT ----- If episodes are very rare then consider getting a kardia monitor ----- Message ----- From: Stephani Police, RN Sent: 09/10/2020   3:02 PM EDT To: Sueanne Margarita, MD, Antonieta Iba, RN

## 2020-10-27 NOTE — Telephone Encounter (Signed)
Left a message for the pt to call back.  

## 2020-10-29 DIAGNOSIS — E785 Hyperlipidemia, unspecified: Secondary | ICD-10-CM | POA: Diagnosis not present

## 2020-10-29 DIAGNOSIS — I2510 Atherosclerotic heart disease of native coronary artery without angina pectoris: Secondary | ICD-10-CM | POA: Diagnosis not present

## 2020-10-29 DIAGNOSIS — Z6822 Body mass index (BMI) 22.0-22.9, adult: Secondary | ICD-10-CM | POA: Diagnosis not present

## 2020-10-29 DIAGNOSIS — E1169 Type 2 diabetes mellitus with other specified complication: Secondary | ICD-10-CM | POA: Diagnosis not present

## 2020-10-31 ENCOUNTER — Other Ambulatory Visit: Payer: Self-pay | Admitting: Cardiology

## 2020-10-31 DIAGNOSIS — I2510 Atherosclerotic heart disease of native coronary artery without angina pectoris: Secondary | ICD-10-CM

## 2020-11-12 DIAGNOSIS — I2510 Atherosclerotic heart disease of native coronary artery without angina pectoris: Secondary | ICD-10-CM | POA: Diagnosis not present

## 2020-11-12 DIAGNOSIS — Z6822 Body mass index (BMI) 22.0-22.9, adult: Secondary | ICD-10-CM | POA: Diagnosis not present

## 2020-11-12 DIAGNOSIS — E785 Hyperlipidemia, unspecified: Secondary | ICD-10-CM | POA: Diagnosis not present

## 2020-11-12 DIAGNOSIS — E1169 Type 2 diabetes mellitus with other specified complication: Secondary | ICD-10-CM | POA: Diagnosis not present

## 2020-11-13 ENCOUNTER — Telehealth: Payer: Self-pay | Admitting: Cardiology

## 2020-11-13 ENCOUNTER — Other Ambulatory Visit: Payer: Self-pay | Admitting: *Deleted

## 2020-11-13 MED ORDER — ROSUVASTATIN CALCIUM 20 MG PO TABS
20.0000 mg | ORAL_TABLET | Freq: Every day | ORAL | 2 refills | Status: DC
Start: 2020-11-13 — End: 2021-03-12

## 2020-11-13 NOTE — Telephone Encounter (Signed)
*  STAT* If patient is at the pharmacy, call can be transferred to refill team.   1. Which medications need to be refilled? (please list name of each medication and dose if known) Rosuvastatin  2. Which pharmacy/location (including street and city if local pharmacy) is medication to be sent to? [place], [place]  3. Do they need a 30 day or 90 day supply? Need enough his first available appointment on 03-12-21

## 2020-12-01 ENCOUNTER — Other Ambulatory Visit: Payer: Self-pay | Admitting: Cardiology

## 2020-12-01 DIAGNOSIS — I2510 Atherosclerotic heart disease of native coronary artery without angina pectoris: Secondary | ICD-10-CM

## 2020-12-04 DIAGNOSIS — C4491 Basal cell carcinoma of skin, unspecified: Secondary | ICD-10-CM | POA: Diagnosis not present

## 2020-12-04 DIAGNOSIS — Z481 Encounter for planned postprocedural wound closure: Secondary | ICD-10-CM | POA: Diagnosis not present

## 2020-12-04 DIAGNOSIS — C44219 Basal cell carcinoma of skin of left ear and external auricular canal: Secondary | ICD-10-CM | POA: Diagnosis not present

## 2020-12-04 DIAGNOSIS — D485 Neoplasm of uncertain behavior of skin: Secondary | ICD-10-CM | POA: Diagnosis not present

## 2020-12-04 DIAGNOSIS — C4441 Basal cell carcinoma of skin of scalp and neck: Secondary | ICD-10-CM | POA: Diagnosis not present

## 2020-12-22 ENCOUNTER — Other Ambulatory Visit: Payer: Self-pay | Admitting: Cardiology

## 2020-12-22 DIAGNOSIS — I2510 Atherosclerotic heart disease of native coronary artery without angina pectoris: Secondary | ICD-10-CM

## 2020-12-24 DIAGNOSIS — E1169 Type 2 diabetes mellitus with other specified complication: Secondary | ICD-10-CM | POA: Diagnosis not present

## 2020-12-24 DIAGNOSIS — E7800 Pure hypercholesterolemia, unspecified: Secondary | ICD-10-CM | POA: Diagnosis not present

## 2020-12-24 DIAGNOSIS — I4891 Unspecified atrial fibrillation: Secondary | ICD-10-CM | POA: Diagnosis not present

## 2020-12-24 DIAGNOSIS — I2510 Atherosclerotic heart disease of native coronary artery without angina pectoris: Secondary | ICD-10-CM | POA: Diagnosis not present

## 2020-12-30 DIAGNOSIS — Z481 Encounter for planned postprocedural wound closure: Secondary | ICD-10-CM | POA: Diagnosis not present

## 2020-12-30 DIAGNOSIS — L905 Scar conditions and fibrosis of skin: Secondary | ICD-10-CM | POA: Diagnosis not present

## 2020-12-30 DIAGNOSIS — C44219 Basal cell carcinoma of skin of left ear and external auricular canal: Secondary | ICD-10-CM | POA: Diagnosis not present

## 2021-01-05 DIAGNOSIS — C4441 Basal cell carcinoma of skin of scalp and neck: Secondary | ICD-10-CM | POA: Diagnosis not present

## 2021-02-05 DIAGNOSIS — I4891 Unspecified atrial fibrillation: Secondary | ICD-10-CM | POA: Diagnosis not present

## 2021-02-05 DIAGNOSIS — E1169 Type 2 diabetes mellitus with other specified complication: Secondary | ICD-10-CM | POA: Diagnosis not present

## 2021-02-05 DIAGNOSIS — I2510 Atherosclerotic heart disease of native coronary artery without angina pectoris: Secondary | ICD-10-CM | POA: Diagnosis not present

## 2021-02-05 DIAGNOSIS — E7800 Pure hypercholesterolemia, unspecified: Secondary | ICD-10-CM | POA: Diagnosis not present

## 2021-02-10 ENCOUNTER — Encounter: Payer: Self-pay | Admitting: Physician Assistant

## 2021-02-10 ENCOUNTER — Ambulatory Visit: Payer: Medicare Other | Admitting: Physician Assistant

## 2021-02-10 ENCOUNTER — Other Ambulatory Visit: Payer: Self-pay

## 2021-02-10 DIAGNOSIS — L570 Actinic keratosis: Secondary | ICD-10-CM | POA: Diagnosis not present

## 2021-02-10 DIAGNOSIS — L219 Seborrheic dermatitis, unspecified: Secondary | ICD-10-CM | POA: Diagnosis not present

## 2021-02-10 DIAGNOSIS — L821 Other seborrheic keratosis: Secondary | ICD-10-CM | POA: Diagnosis not present

## 2021-02-10 MED ORDER — TOLAK 4 % EX CREA: TOPICAL_CREAM | CUTANEOUS | 0 refills | Status: DC

## 2021-02-10 MED ORDER — KETOCONAZOLE 2 % EX SHAM: MEDICATED_SHAMPOO | CUTANEOUS | 11 refills | Status: AC

## 2021-02-10 NOTE — Progress Notes (Signed)
° °  Follow-Up Visit   Subjective  [name] is a 81 y.o. male who presents for the following: Annual Exam (Pt here for annual. Pt has hx of scc. Pt has spots of concern on the chest, pt denied gown ).   The following portions of the chart were reviewed this encounter and updated as appropriate:  Tobacco   Allergies   Meds   Problems   Med Hx   Surg Hx   Fam Hx       Objective  Well appearing patient in no apparent distress; mood and affect are within normal limits.  All skin waist up examined.  Chest (Upper Torso, Anterior) Stuck-on, waxy, tan-brown papules and plaques. --Discussed benign etiology and prognosis.   Head - Anterior (Face) Erythematous patches with gritty scale.  Scalp Skin was clear today   Assessment & Plan  Seborrheic keratosis Chest (Upper Torso, Anterior)  Observe for change  Actinic keratosis Head - Anterior (Face)  Fluorouracil (TOLAK) 4 % CREA - Head - Anterior (Face) APPLY TO AFFECTED AREA NIGHTLY X2WEEKS  Seborrheic dermatitis Scalp  ketoconazole (NIZORAL) 2 % shampoo - Scalp Apply to scalp- let sit 3-5 minutes before rinsing    I, Eather Chaires, PA-C, have reviewed all documentation's for this visit.  The documentation on 02/10/21 for the exam, diagnosis, procedures and orders are all accurate and complete.

## 2021-02-10 NOTE — Patient Instructions (Addendum)
CONTACT [facility] TOMORROW AT [phone] FOR THE CREAM FOR THE FACE

## 2021-03-11 ENCOUNTER — Other Ambulatory Visit: Payer: Self-pay | Admitting: Cardiology

## 2021-03-11 NOTE — Progress Notes (Addendum)
Date:  03/12/2021   ID:  [name], DOB [birth year], MRN [id number]   PCP:  Lujean Amel, MD  Cardiologist:  Fransico Him, MD  Electrophysiologist:  None   Chief Complaint:  CAD, Hyperlipidemia, DM  History of Present Illness:    [name] is a 81 y.o. male with a hx of ASCAD with one vessel CAD status post PCI of left circumflex and OM2 in 2009 and PCI of left circ 2010 for restenosis.  Stress Myoview 11/17 showed normal  EF 57% with no ischemia.  He also has a history of type 2 DM diet controlled and hyperlipidemia.   He is here today for followup and is doing well.  He tells me that he has had some vague chest discomfort recently in the morning.  It is nonexertional and does not radiate.  There are no associated sx of nausea or diaphoresis. He has a lot of muscular pain in his shoulders and is not sure it may be related to MSK etiology.  He has had some DOE when he works out in his [place] out things and doing heavy lifting.  He has chronic LLE edema which is stable. He denies any PND, orthopnea, dizziness, palpitations or syncope. He is compliant with his meds and is tolerating meds with no SE.     Prior CV studies:   The following studies were reviewed today:  None  Past Medical History:  Diagnosis Date   Atypical mole 04/18/2002   slight to moderate mid back    Atypical mole 05/02/2007   right inner lower leg (moderate)   Basal cell carcinoma 08/06/2008   left ear (mohs)   BCC (basal cell carcinoma) 08/06/2008   right anterior sholder (cx33fu)    BCC (basal cell carcinoma) 03/14/2009   left post neck (clear)   BCC (basal cell carcinoma) 11/20/2013   right post sholder (cx30fu)   BCC (basal cell carcinoma) 02/23/2017   mid chest (cx74fu)    CAD (coronary artery disease)    1 vessel CAD status post PCI of LCx and OM2 in 2009 and PCI of LCx 2010 for restenosis. Myoview 11/17: EF 57% with no ischemia   Colon polyp 2006   repeat in 2 years   COPD  (chronic obstructive pulmonary disease) (HCC)    Diabetes mellitus without complication ([place])    Diet-controlled non-insulin-dependent   Erectile dysfunction    History of tobacco abuse 04/2007   COPD on CXR   Hyperlipidemia LDL goal <70    Prostate cancer ([place])    Squamous cell carcinoma of skin 12/11/2019   well diff- left eyebrown -MOHS   Past Surgical History:  Procedure Laterality Date   CATARACT EXTRACTION, BILATERAL  2 years ago   CYSTOSCOPY WITH LITHOLAPAXY N/A 09/21/2017   Procedure: CYSTOSCOPY WITH LITHOLAPAXY;  Surgeon: Lucas Mallow, MD;  Location: [facility];  Service: Urology;  Laterality: N/A;   PERCUTANEOUS CORONARY STENT INTERVENTION (PCI-S)  05/2007   PCI of LCx 03/2008 for stenosis above prior stent   PROSTATE BIOPSY       Current Meds  Medication Sig   alum & mag hydroxide-simeth (MAALOX/MYLANTA) 200-200-20 MG/5ML suspension Take 15 mLs by mouth daily as needed for indigestion or heartburn.   aspirin EC 81 MG tablet Take 81 mg by mouth daily.   Carboxymethylcellul-Glycerin (LUBRICATING EYE DROPS OP) Place 1 drop into both eyes daily as needed (dry eyes).   clopidogrel (PLAVIX) 75 MG tablet Take 1 tablet (75  mg total) by mouth daily. Please keep upcoming appt in February 2023 with Dr. Radford Pax before anymore refills. Thank you   Fluorouracil (TOLAK) 4 % CREA APPLY TO AFFECTED AREA NIGHTLY X2WEEKS   glimepiride (AMARYL) 4 MG tablet Take 4 mg by mouth daily with breakfast.   ibuprofen (ADVIL,MOTRIN) 200 MG tablet Take 200 mg by mouth 2 (two) times daily as needed for headache or moderate pain.   JARDIANCE 10 MG TABS tablet Take 10 mg by mouth daily.   ketoconazole (NIZORAL) 2 % shampoo Apply to scalp- let sit 3-5 minutes before rinsing   lisinopril (PRINIVIL,ZESTRIL) 2.5 MG tablet Take 2.5 mg by mouth every evening. Patient doesn't take this to manage BP. He takes this to prevent protein spillage.   metFORMIN (GLUCOPHAGE) 500 MG tablet Take 1,000 mg by mouth 2 (two)  times daily with a meal.   nitroGLYCERIN (NITROSTAT) 0.4 MG SL tablet Place 1 tablet (0.4 mg total) under the tongue every 5 (five) minutes as needed. Please make yearly appt with Dr. Radford Pax for April. 1st attempt   rosuvastatin (CRESTOR) 20 MG tablet Take 1 tablet (20 mg total) by mouth daily.   tamsulosin (FLOMAX) 0.4 MG CAPS capsule Take 1 capsule (0.4 mg total) by mouth at bedtime.     Allergies:   Sulfa antibiotics and Zocor [simvastatin]   Social History   Tobacco Use   Smoking status: Former    Packs/day: 0.25    Years: 40.00    Pack years: 10.00    Types: Cigarettes    Quit date: 12/21/2003    Years since quitting: 17.2   Smokeless tobacco: Never  Vaping Use   Vaping Use: Never used  Substance Use Topics   Alcohol use: Yes    Comment: ocassional beer or glass of wine   Drug use: Yes    Types: Marijuana    Comment: last use 2 weeks      Family Hx: The patient's family history includes Arthritis in his mother; Cancer in his sister; Diabetes in his mother; Heart attack in his father.  ROS:   Please see the history of present illness.     All other systems reviewed and are negative.   Labs/Other Tests and Data Reviewed:    Recent Labs: No results found for requested labs within last 8760 hours.   Recent Lipid Panel Lab Results  Component Value Date/Time   CHOL 111 02/26/2014 12:20 PM   TRIG 155 (H) 02/26/2014 12:20 PM   HDL 31 (L) 02/26/2014 12:20 PM   CHOLHDL 4.8 04/02/2008 06:10 AM   LDLCALC 49 02/26/2014 12:20 PM    Wt Readings from Last 3 Encounters:  03/12/21 145 lb (65.8 kg)  09/26/19 146 lb (66.2 kg)  03/28/19 148 lb 1.6 oz (67.2 kg)     Objective:    Vital Signs:  BP 100/62    Pulse 78    Ht 5\' 8"  (1.727 m)    Wt 145 lb (65.8 kg)    SpO2 98%    BMI 22.05 kg/m   GEN: Well nourished, well developed in no acute distress HEENT: Normal NECK: No JVD; No carotid bruits LYMPHATICS: No lymphadenopathy CARDIAC:RRR, no murmurs, rubs,  gallops RESPIRATORY:  Clear to auscultation without rales, wheezing or rhonchi  ABDOMEN: Soft, non-tender, non-distended MUSCULOSKELETAL:  No edema; No deformity  SKIN: Warm and dry NEUROLOGIC:  Alert and oriented x 3 PSYCHIATRIC:  Normal affect    EKG was not done in office   ASSESSMENT & PLAN:  1.  ASCAD  -1 vessel CAD s/p PCI of LCx and OM2 in 2009 and PCI of LCx 2010 for restenosis.  -Myoview 11/17: EF 57% with no ischemia.   -He has had some recent atypical CP and DOE.  These sx are new. -I will get a stress myoview to rule out ischemia since he has not had any ischemic workup in a few years -Shared Decision Making/Informed Consent The risks [chest pain, shortness of breath, cardiac arrhythmias, dizziness, blood pressure fluctuations, myocardial infarction, stroke/transient ischemic attack, nausea, vomiting, allergic reaction, radiation exposure, metallic taste sensation and life-threatening complications (estimated to be 1 in 10,000)], benefits (risk stratification, diagnosing coronary artery disease, treatment guidance) and alternatives of a nuclear stress test were discussed in detail with [name] and he agrees to proceed. -Continue aspirin 81 mg daily, Plavix 75 mg daily, rosuvastatin 20 mg daily with as needed refills  2.  Hyperlipidemia  - his LDL goal is < 70.  -I have personally reviewed and interpreted outside labs performed by patient's PCP which showed LDL 53, HDL 36, ALT 32 on 09/11/2020 -Continue rosuvastatin 20 mg daily with as needed refills  3.  Type 2 DM  -HbA1C 7.3% in April 20 -Followed by PCP -Continue prescription drug management metformin 1000 mg twice daily, Jardiance 10 mg daily and glimepiride 4 mg daily with as needed refills -continue ACE I and statin  4.  LE edema -This is well controlled on low-sodium diet   Medication Adjustments/Labs and Tests Ordered: Current medicines are reviewed at length with the patient today.  Concerns regarding  medicines are outlined above.  Tests Ordered: No orders of the defined types were placed in this encounter.  Medication Changes: No orders of the defined types were placed in this encounter.   Disposition:  Follow up in 1 year(s)  Signed, Fransico Him, MD  03/12/2021 10:37 AM    [facility]

## 2021-03-12 ENCOUNTER — Ambulatory Visit: Payer: Medicare Other | Admitting: Cardiology

## 2021-03-12 ENCOUNTER — Other Ambulatory Visit: Payer: Self-pay

## 2021-03-12 ENCOUNTER — Encounter: Payer: Self-pay | Admitting: Cardiology

## 2021-03-12 VITALS — BP 100/62 | HR 78 | Ht 68.0 in | Wt 145.0 lb

## 2021-03-12 DIAGNOSIS — N181 Chronic kidney disease, stage 1: Secondary | ICD-10-CM | POA: Diagnosis not present

## 2021-03-12 DIAGNOSIS — R600 Localized edema: Secondary | ICD-10-CM

## 2021-03-12 DIAGNOSIS — E1122 Type 2 diabetes mellitus with diabetic chronic kidney disease: Secondary | ICD-10-CM | POA: Diagnosis not present

## 2021-03-12 DIAGNOSIS — E7800 Pure hypercholesterolemia, unspecified: Secondary | ICD-10-CM | POA: Diagnosis not present

## 2021-03-12 DIAGNOSIS — I2510 Atherosclerotic heart disease of native coronary artery without angina pectoris: Secondary | ICD-10-CM

## 2021-03-12 MED ORDER — CLOPIDOGREL BISULFATE 75 MG PO TABS: 75.0000 mg | ORAL_TABLET | Freq: Every day | ORAL | 3 refills | Status: DC

## 2021-03-12 MED ORDER — ROSUVASTATIN CALCIUM 20 MG PO TABS: 20.0000 mg | ORAL_TABLET | Freq: Every day | ORAL | 3 refills | Status: DC

## 2021-03-12 NOTE — Addendum Note (Signed)
Addended by: Molli Barrows on: 03/12/2021 10:53 AM   Modules accepted: Orders

## 2021-03-12 NOTE — Patient Instructions (Signed)
Medication Instructions:  Your physician recommends that you continue on your current medications as directed. Please refer to the Current Medication list given to you today.  *If you need a refill on your cardiac medications before your next appointment, please call your pharmacy*   Testing/Procedures: Your physician has requested that you have en exercise stress myoview. For further information please visit [email or web address]. Please follow instruction sheet, as given.    Follow-Up: At [facility], you and your health needs are our priority.  As part of our continuing mission to provide you with exceptional heart care, we have created designated Provider Care Teams.  These Care Teams include your primary Cardiologist (physician) and Advanced Practice Providers (APPs -  Physician Assistants and Nurse Practitioners) who all work together to provide you with the care you need, when you need it.  We recommend signing up for the patient portal called "MyChart".  Sign up information is provided on this After Visit Summary.  MyChart is used to connect with patients for Virtual Visits (Telemedicine).  Patients are able to view lab/test results, encounter notes, upcoming appointments, etc.  Non-urgent messages can be sent to your provider as well.   To learn more about what you can do with MyChart, go to [email or web address].    Your next appointment:   1 year(s)  The format for your next appointment:   In Person  Provider:   Fransico Him, MD

## 2021-03-12 NOTE — Addendum Note (Signed)
Addended by: Jaela Yepez R on: 03/12/2021 11:05 AM ° ° Modules accepted: Orders ° °

## 2021-03-18 ENCOUNTER — Telehealth (HOSPITAL_COMMUNITY): Payer: Self-pay

## 2021-03-18 NOTE — Telephone Encounter (Signed)
Detailed instructions left on the patient's answering machine. Asked to call back with any questions. S.Ranata Laughery EMTP 

## 2021-03-24 ENCOUNTER — Ambulatory Visit (HOSPITAL_COMMUNITY): Payer: Medicare Other | Attending: Cardiovascular Disease

## 2021-03-24 ENCOUNTER — Other Ambulatory Visit: Payer: Self-pay

## 2021-03-24 DIAGNOSIS — E1122 Type 2 diabetes mellitus with diabetic chronic kidney disease: Secondary | ICD-10-CM | POA: Diagnosis not present

## 2021-03-24 DIAGNOSIS — I2510 Atherosclerotic heart disease of native coronary artery without angina pectoris: Secondary | ICD-10-CM | POA: Insufficient documentation

## 2021-03-24 DIAGNOSIS — R600 Localized edema: Secondary | ICD-10-CM | POA: Insufficient documentation

## 2021-03-24 DIAGNOSIS — E7800 Pure hypercholesterolemia, unspecified: Secondary | ICD-10-CM | POA: Insufficient documentation

## 2021-03-24 DIAGNOSIS — N181 Chronic kidney disease, stage 1: Secondary | ICD-10-CM | POA: Insufficient documentation

## 2021-03-24 LAB — MYOCARDIAL PERFUSION IMAGING
Angina Index: 0
Duke Treadmill Score: 7
Estimated workload: 7
Exercise duration (min): 6 min
Exercise duration (sec): 30 s
LV dias vol: 44 mL (ref 62–150)
LV sys vol: 14 mL
MPHR: 141 {beats}/min
Nuc Stress EF: 68 %
Peak HR: 130 {beats}/min
Percent HR: 92 %
RPE: 18
Rest HR: 65 {beats}/min
Rest Nuclear Isotope Dose: 9.8 mCi
SDS: 1
SRS: 0
SSS: 1
ST Depression (mm): 0 mm
Stress Nuclear Isotope Dose: 30.8 mCi
TID: 0.93

## 2021-03-24 MED ORDER — TECHNETIUM TC 99M TETROFOSMIN IV KIT
30.8000 | PACK | Freq: Once | INTRAVENOUS | Status: AC | PRN
  Administered 2021-03-24: 30.8 via INTRAVENOUS
  Filled 2021-03-24: qty 31

## 2021-03-24 MED ORDER — TECHNETIUM TC 99M TETROFOSMIN IV KIT
9.8000 | PACK | Freq: Once | INTRAVENOUS | Status: AC | PRN
  Administered 2021-03-24: 9.8 via INTRAVENOUS
  Filled 2021-03-24: qty 10

## 2021-03-25 ENCOUNTER — Telehealth: Payer: Self-pay

## 2021-03-25 DIAGNOSIS — R002 Palpitations: Secondary | ICD-10-CM

## 2021-03-25 DIAGNOSIS — R0602 Shortness of breath: Secondary | ICD-10-CM

## 2021-03-25 NOTE — Telephone Encounter (Signed)
Left message for patient advising that Dr. Radford Pax has ordered and echocardiogram. Advised to call back with any questions. ?

## 2021-03-25 NOTE — Telephone Encounter (Signed)
-----   Message from Sueanne Margarita, MD sent at 03/25/2021  3:59 PM EST ----- ?Please get a 2D echo for SOB ?----- Message ----- ?From: Antonieta Iba, RN ?Sent: 03/25/2021   1:09 PM EST ?To: Sueanne Margarita, MD ? ?The patient has been notified of the result and verbalized understanding.  All questions (if any) were answered. ?Antonieta Iba, RN 03/25/2021 1:06 PM  ? ?Patient states that he has some shortness of breath still but no further chest pain.  ? ?

## 2021-04-01 ENCOUNTER — Other Ambulatory Visit: Payer: Self-pay

## 2021-04-01 ENCOUNTER — Ambulatory Visit (HOSPITAL_COMMUNITY): Payer: Medicare Other | Attending: Cardiovascular Disease

## 2021-04-01 DIAGNOSIS — R0602 Shortness of breath: Secondary | ICD-10-CM | POA: Diagnosis not present

## 2021-04-01 DIAGNOSIS — R002 Palpitations: Secondary | ICD-10-CM | POA: Insufficient documentation

## 2021-04-01 LAB — ECHOCARDIOGRAM COMPLETE
Area-P 1/2: 3.31 cm2
S' Lateral: 2.8 cm

## 2021-05-14 DIAGNOSIS — I2510 Atherosclerotic heart disease of native coronary artery without angina pectoris: Secondary | ICD-10-CM | POA: Diagnosis not present

## 2021-05-14 DIAGNOSIS — E7800 Pure hypercholesterolemia, unspecified: Secondary | ICD-10-CM | POA: Diagnosis not present

## 2021-05-14 DIAGNOSIS — E1169 Type 2 diabetes mellitus with other specified complication: Secondary | ICD-10-CM | POA: Diagnosis not present

## 2021-06-16 DIAGNOSIS — Z0000 Encounter for general adult medical examination without abnormal findings: Secondary | ICD-10-CM | POA: Diagnosis not present

## 2021-06-16 DIAGNOSIS — R1319 Other dysphagia: Secondary | ICD-10-CM | POA: Diagnosis not present

## 2021-06-16 DIAGNOSIS — E1169 Type 2 diabetes mellitus with other specified complication: Secondary | ICD-10-CM | POA: Diagnosis not present

## 2021-06-16 DIAGNOSIS — E7800 Pure hypercholesterolemia, unspecified: Secondary | ICD-10-CM | POA: Diagnosis not present

## 2021-06-16 DIAGNOSIS — H9190 Unspecified hearing loss, unspecified ear: Secondary | ICD-10-CM | POA: Diagnosis not present

## 2021-06-16 DIAGNOSIS — Z79899 Other long term (current) drug therapy: Secondary | ICD-10-CM | POA: Diagnosis not present

## 2021-06-16 DIAGNOSIS — I4891 Unspecified atrial fibrillation: Secondary | ICD-10-CM | POA: Diagnosis not present

## 2021-06-16 DIAGNOSIS — I2510 Atherosclerotic heart disease of native coronary artery without angina pectoris: Secondary | ICD-10-CM | POA: Diagnosis not present

## 2021-07-09 DIAGNOSIS — W260XXA Contact with knife, initial encounter: Secondary | ICD-10-CM | POA: Diagnosis not present

## 2021-07-09 DIAGNOSIS — S61213A Laceration without foreign body of left middle finger without damage to nail, initial encounter: Secondary | ICD-10-CM | POA: Diagnosis not present

## 2021-07-09 DIAGNOSIS — S61215A Laceration without foreign body of left ring finger without damage to nail, initial encounter: Secondary | ICD-10-CM | POA: Diagnosis not present

## 2021-07-20 DIAGNOSIS — R1319 Other dysphagia: Secondary | ICD-10-CM | POA: Diagnosis not present

## 2021-07-21 DIAGNOSIS — E230 Hypopituitarism: Secondary | ICD-10-CM | POA: Diagnosis not present

## 2021-07-21 DIAGNOSIS — R3911 Hesitancy of micturition: Secondary | ICD-10-CM | POA: Diagnosis not present

## 2021-07-23 ENCOUNTER — Telehealth: Payer: Self-pay | Admitting: *Deleted

## 2021-07-23 NOTE — Telephone Encounter (Signed)
   Name: [name]  DOB: [birth year]  MRN: [id number]  Primary Cardiologist: Fransico Him, MD  Chart reviewed as part of pre-operative protocol coverage. Because of [name]'s past medical history and time since last visit, he will require a follow-up tele-visit in order to better assess preoperative cardiovascular risk.  Pre-op covering staff: - Please schedule appointment and call patient to inform them. If patient already had an upcoming appointment within acceptable timeframe, please add "pre-op clearance" to the appointment notes so provider is aware. - Please contact requesting surgeon's office via preferred method (i.e, phone, fax) to inform them of need for appointment prior to surgery.  Elgie Collard, PA-C  07/23/2021, 7:56 AM

## 2021-07-23 NOTE — Telephone Encounter (Signed)
   Pre-operative Risk Assessment    Patient Name: [name]  DOB: [birth year] MRN: [id number]      Request for Surgical Clearance    Procedure:   EGD W/DIL  Date of Surgery:  Clearance 09/21/21                                 Surgeon:  Dr. Alessandra Bevels Surgeon's Group or Practice Name:  [facility] Phone number:  [phone] Fax number:  [phone]   Type of Clearance Requested:   - Medical    Type of Anesthesia:   PROPOFOL   Additional requests/questions:    Signed, Jeanann Lewandowsky   07/23/2021, 7:00 AM

## 2021-07-23 NOTE — Telephone Encounter (Signed)
Pt has been scheduled for a tele-visit 07/24/21 1:00.  Medications reconciled / consent on file.

## 2021-07-23 NOTE — Telephone Encounter (Signed)
Pt has been scheduled for a tele-visit 07/24/21 1:00.  Consent on file / medications reconciled.    Patient Consent for Virtual Visit        [name] has provided verbal consent on 07/23/2021 for a virtual visit (video or telephone).   CONSENT FOR VIRTUAL VISIT FOR:  [name]  By participating in this virtual visit I agree to the following:  I hereby voluntarily request, consent and authorize [facility] and its employed or contracted physicians, physician assistants, nurse practitioners or other licensed health care professionals (the Practitioner), to provide me with telemedicine health care services (the "Services") as deemed necessary by the treating Practitioner. I acknowledge and consent to receive the Services by the Practitioner via telemedicine. I understand that the telemedicine visit will involve communicating with the Practitioner through live audiovisual communication technology and the disclosure of certain medical information by electronic transmission. I acknowledge that I have been given the opportunity to request an in-person assessment or other available alternative prior to the telemedicine visit and am voluntarily participating in the telemedicine visit.  I understand that I have the right to withhold or withdraw my consent to the use of telemedicine in the course of my care at any time, without affecting my right to future care or treatment, and that the Practitioner or I may terminate the telemedicine visit at any time. I understand that I have the right to inspect all information obtained and/or recorded in the course of the telemedicine visit and may receive copies of available information for a reasonable fee.  I understand that some of the potential risks of receiving the Services via telemedicine include:  Delay or interruption in medical evaluation due to technological equipment failure or disruption; Information transmitted may not be sufficient (e.g.  poor resolution of images) to allow for appropriate medical decision making by the Practitioner; and/or  In rare instances, security protocols could fail, causing a breach of personal health information.  Furthermore, I acknowledge that it is my responsibility to provide information about my medical history, conditions and care that is complete and accurate to the best of my ability. I acknowledge that Practitioner's advice, recommendations, and/or decision may be based on factors not within their control, such as incomplete or inaccurate data provided by me or distortions of diagnostic images or specimens that may result from electronic transmissions. I understand that the practice of medicine is not an exact science and that Practitioner makes no warranties or guarantees regarding treatment outcomes. I acknowledge that a copy of this consent can be made available to me via my patient portal ([place]), or I can request a printed copy by calling the office of [facility].    I understand that my insurance will be billed for this visit.   I have read or had this consent read to me. I understand the contents of this consent, which adequately explains the benefits and risks of the Services being provided via telemedicine.  I have been provided ample opportunity to ask questions regarding this consent and the Services and have had my questions answered to my satisfaction. I give my informed consent for the services to be provided through the use of telemedicine in my medical care

## 2021-07-24 ENCOUNTER — Ambulatory Visit (INDEPENDENT_AMBULATORY_CARE_PROVIDER_SITE_OTHER): Payer: Medicare Other | Admitting: Physician Assistant

## 2021-07-24 DIAGNOSIS — Z01810 Encounter for preprocedural cardiovascular examination: Secondary | ICD-10-CM | POA: Diagnosis not present

## 2021-07-24 NOTE — Progress Notes (Signed)
Virtual Visit via Telephone Note   Because of [name]'s co-morbid illnesses, he is at least at moderate risk for complications without adequate follow up.  This format is felt to be most appropriate for this patient at this time.  The patient did not have access to video technology/had technical difficulties with video requiring transitioning to audio format only (telephone).  All issues noted in this document were discussed and addressed.  No physical exam could be performed with this format.  Please refer to the patient's chart for his consent to telehealth for [facility].  Evaluation Performed:  Preoperative cardiovascular risk assessment _____________   Date:  07/24/2021   Patient ID:  [name], DOB [birth year], MRN [id number] Patient Location:  Home Provider location:   Office  Primary Care Provider:  Lujean Amel, MD Primary Cardiologist:  Fransico Him, MD  Chief Complaint / Patient Profile   81 y.o. y/o male with a h/o AAS CAD with one-vessel CAD status post PCI of left circumflex and OM 2 in 2009 and PCI of left circumflex 2010 for restenosis (stress Myoview 11/17 showed normal EF 57% with no ischemia), type 2 diabetes mellitus, and hyperlipidemia who is pending EGD and presents today for telephonic preoperative cardiovascular risk assessment.  Past Medical History    Past Medical History:  Diagnosis Date   Atypical mole 04/18/2002   slight to moderate mid back    Atypical mole 05/02/2007   right inner lower leg (moderate)   Basal cell carcinoma 08/06/2008   left ear (mohs)   BCC (basal cell carcinoma) 08/06/2008   right anterior sholder (cx57f)    BCC (basal cell carcinoma) 03/14/2009   left post neck (clear)   BCC (basal cell carcinoma) 11/20/2013   right post sholder (cx349f   BCC (basal cell carcinoma) 02/23/2017   mid chest (cx3558f   CAD (coronary artery disease)    1 vessel CAD status post PCI of LCx and OM2 in 2009 and PCI of LCx  2010 for restenosis. Myoview 11/17: EF 57% with no ischemia   Colon polyp 2006   repeat in 2 years   COPD (chronic obstructive pulmonary disease) (HCC)    Diabetes mellitus [place]  Diet-controlled non-insulin-dependent   Erectile dysfunction    History of tobacco abuse 04/2007   COPD on CXR   Hyperlipidemia LDL goal <70    Prostate cancer ([facility]  Squamous cell carcinoma of skin 12/11/2019   well diff- left eyebrown -MOHS   Past Surgical History:  Procedure Laterality Date   CATARACT EXTRACTION, BILATERAL  2 years ago   CYSTOSCOPY WITH LITHOLAPAXY N/A 09/21/2017   Procedure: CYSTOSCOPY WITH LITHOLAPAXY;  Surgeon: BelLucas MallowD;  Location: [facility];  Service: Urology;  Laterality: N/A;   PERCUTANEOUS CORONARY STENT INTERVENTION (PCI-S)  05/2007   PCI of LCx 03/2008 for stenosis above prior stent   PROSTATE BIOPSY      Allergies  Allergies  Allergen Reactions   Sulfa Antibiotics Other (See Comments)    Severe Headaches   Zocor [Simvastatin] Other (See Comments)    Aches and myalgias    History of Present Illness    [name] a [age]o. male who presents via audio conferencing for a telehealth visit today.  Pt was last seen in cardiology clinic on 03/12/2021 by Dr. TraFransico HimAt that time Thomas FRIEDTs doing well .  He was having some DOE when  he was working out in his [place] cleaning things and doing heavy lifting.  A stress test Myoview was ordered at that time which showed normal LVEF of 68%, normal LV perfusion, and no ST deviation.  The patient is now pending procedure as outlined above. Since his last visit, he states that his shortness of breath comes and goes.  It has not really changed since his last appointment with Dr. Radford Pax.  He is very active and is able to do all of his own housework and yard work.  He also is able to move around heavy furniture if needed.  Because of this he surpasses the 4 METS minimum requirement for the  DASI.  He would like to document that he apologizes for his interaction with our nursing staff when they called to verify this appointment.  He states that he was rude and did not mean to be.  He has a history of PCI back in 2009 and 2010.  He remains on Plavix and aspirin.  It is okay for him to stop his Plavix 5 days prior to the procedure and continue his aspirin.  Reports no shortness of breath nor dyspnea on exertion. Reports no chest pain, pressure, or tightness. No edema, orthopnea, PND. Reports no palpitations.    Home Medications    Prior to Admission medications   Medication Sig Start Date End Date Taking? Authorizing Provider  alum & mag hydroxide-simeth (MAALOX/MYLANTA) 200-200-20 MG/5ML suspension Take 15 mLs by mouth daily as needed for indigestion or heartburn.    [provider]  aspirin EC 81 MG tablet Take 81 mg by mouth daily.    [provider]  Carboxymethylcellul-Glycerin (LUBRICATING EYE DROPS OP) Place 1 drop into both eyes daily as needed (dry eyes).    [provider]  clopidogrel (PLAVIX) 75 MG tablet Take 1 tablet (75 mg total) by mouth daily. Please keep upcoming appt in February 2023 with Dr. Radford Pax before anymore refills. Thank you 03/12/21   Sueanne Margarita, MD  glimepiride (AMARYL) 4 MG tablet Take 4 mg by mouth daily with breakfast.    [provider]  ibuprofen (ADVIL,MOTRIN) 200 MG tablet Take 200 mg by mouth 2 (two) times daily as needed for headache or moderate pain.    [provider]  JARDIANCE 10 MG TABS tablet Take 10 mg by mouth daily. 09/03/19   [provider]  ketoconazole (NIZORAL) 2 % shampoo Apply to scalp- let sit 3-5 minutes before rinsing 02/10/21   [place], Vida Roller R, PA-C  lisinopril (PRINIVIL,ZESTRIL) 2.5 MG tablet Take 2.5 mg by mouth every evening. Patient doesn't take this to manage BP. He takes this to prevent protein spillage.    [provider]  metFORMIN (GLUCOPHAGE) 500 MG  tablet Take 1,000 mg by mouth 2 (two) times daily with a meal.    [provider]  nitroGLYCERIN (NITROSTAT) 0.4 MG SL tablet Place 1 tablet (0.4 mg total) under the tongue every 5 (five) minutes as needed. Please make yearly appt with Dr. Radford Pax for April. 1st attempt 04/18/19   Sueanne Margarita, MD  rosuvastatin (CRESTOR) 20 MG tablet Take 1 tablet (20 mg total) by mouth daily. 03/12/21   Sueanne Margarita, MD  sildenafil (REVATIO) 20 MG tablet Take 40-60 mg by mouth daily as needed (erectile dysfunction).    [provider]  tamsulosin (FLOMAX) 0.4 MG CAPS capsule Take 1 capsule (0.4 mg total) by mouth at bedtime. 09/06/16   Hayden Pedro, PA-C  Physical Exam    Vital Signs:  [name] does not have vital signs available for review today.  Given telephonic nature of communication, physical exam is limited. AAOx3. NAD. Normal affect.  Speech and respirations are unlabored.  Accessory Clinical Findings    None  Assessment & Plan    1.  Preoperative Cardiovascular Risk Assessment:  [name] perioperative risk of a major cardiac event is 0.9% according to the Revised Cardiac Risk Index (RCRI).  Therefore, he is at low risk for perioperative complications.   His functional capacity is good at 7.26 METs according to the [place] Activity Status Index (DASI). Recommendations: According to ACC/AHA guidelines, no further cardiovascular testing needed.  The patient may proceed to surgery at acceptable risk.   Antiplatelet and/or Anticoagulation Recommendations: Clopidogrel (Plavix) can be held for 5 days prior to his surgery and resumed as soon as possible post op.  Continue ASA 81 mg.  A copy of this note will be routed to requesting surgeon.  Time:   Today, I have spent 10 minutes with the patient with telehealth technology discussing medical history, symptoms, and management plan.     Thomas Collard, PA-C  07/24/2021, 1:21 PM

## 2021-07-27 DIAGNOSIS — H903 Sensorineural hearing loss, bilateral: Secondary | ICD-10-CM | POA: Diagnosis not present

## 2021-08-17 DIAGNOSIS — E1169 Type 2 diabetes mellitus with other specified complication: Secondary | ICD-10-CM | POA: Diagnosis not present

## 2021-08-17 DIAGNOSIS — I2510 Atherosclerotic heart disease of native coronary artery without angina pectoris: Secondary | ICD-10-CM | POA: Diagnosis not present

## 2021-08-17 DIAGNOSIS — E7800 Pure hypercholesterolemia, unspecified: Secondary | ICD-10-CM | POA: Diagnosis not present

## 2021-09-16 DIAGNOSIS — E1169 Type 2 diabetes mellitus with other specified complication: Secondary | ICD-10-CM | POA: Diagnosis not present

## 2021-09-23 DIAGNOSIS — E1169 Type 2 diabetes mellitus with other specified complication: Secondary | ICD-10-CM | POA: Diagnosis not present

## 2021-09-23 DIAGNOSIS — I2510 Atherosclerotic heart disease of native coronary artery without angina pectoris: Secondary | ICD-10-CM | POA: Diagnosis not present

## 2021-09-23 DIAGNOSIS — E7800 Pure hypercholesterolemia, unspecified: Secondary | ICD-10-CM | POA: Diagnosis not present

## 2021-09-24 DIAGNOSIS — K3189 Other diseases of stomach and duodenum: Secondary | ICD-10-CM | POA: Diagnosis not present

## 2021-09-24 DIAGNOSIS — K449 Diaphragmatic hernia without obstruction or gangrene: Secondary | ICD-10-CM | POA: Diagnosis not present

## 2021-09-24 DIAGNOSIS — R1310 Dysphagia, unspecified: Secondary | ICD-10-CM | POA: Diagnosis not present

## 2021-09-24 DIAGNOSIS — K222 Esophageal obstruction: Secondary | ICD-10-CM | POA: Diagnosis not present

## 2021-09-24 DIAGNOSIS — K317 Polyp of stomach and duodenum: Secondary | ICD-10-CM | POA: Diagnosis not present

## 2021-09-29 DIAGNOSIS — K317 Polyp of stomach and duodenum: Secondary | ICD-10-CM | POA: Diagnosis not present

## 2021-10-05 DIAGNOSIS — I10 Essential (primary) hypertension: Secondary | ICD-10-CM | POA: Diagnosis not present

## 2021-10-05 DIAGNOSIS — E1169 Type 2 diabetes mellitus with other specified complication: Secondary | ICD-10-CM | POA: Diagnosis not present

## 2021-10-05 DIAGNOSIS — I2510 Atherosclerotic heart disease of native coronary artery without angina pectoris: Secondary | ICD-10-CM | POA: Diagnosis not present

## 2021-10-05 DIAGNOSIS — E785 Hyperlipidemia, unspecified: Secondary | ICD-10-CM | POA: Diagnosis not present

## 2021-10-09 DIAGNOSIS — E7800 Pure hypercholesterolemia, unspecified: Secondary | ICD-10-CM | POA: Diagnosis not present

## 2021-10-09 DIAGNOSIS — I2510 Atherosclerotic heart disease of native coronary artery without angina pectoris: Secondary | ICD-10-CM | POA: Diagnosis not present

## 2021-10-09 DIAGNOSIS — E1169 Type 2 diabetes mellitus with other specified complication: Secondary | ICD-10-CM | POA: Diagnosis not present

## 2021-10-12 DIAGNOSIS — S61211A Laceration without foreign body of left index finger without damage to nail, initial encounter: Secondary | ICD-10-CM | POA: Diagnosis not present

## 2021-10-14 DIAGNOSIS — E1169 Type 2 diabetes mellitus with other specified complication: Secondary | ICD-10-CM | POA: Diagnosis not present

## 2021-10-16 DIAGNOSIS — R1310 Dysphagia, unspecified: Secondary | ICD-10-CM | POA: Diagnosis not present

## 2021-10-27 DIAGNOSIS — E7800 Pure hypercholesterolemia, unspecified: Secondary | ICD-10-CM | POA: Diagnosis not present

## 2021-10-27 DIAGNOSIS — E1169 Type 2 diabetes mellitus with other specified complication: Secondary | ICD-10-CM | POA: Diagnosis not present

## 2021-12-09 DIAGNOSIS — L089 Local infection of the skin and subcutaneous tissue, unspecified: Secondary | ICD-10-CM | POA: Diagnosis not present

## 2021-12-11 DIAGNOSIS — M7989 Other specified soft tissue disorders: Secondary | ICD-10-CM | POA: Diagnosis not present

## 2022-01-07 DIAGNOSIS — E349 Endocrine disorder, unspecified: Secondary | ICD-10-CM | POA: Diagnosis not present

## 2022-01-07 DIAGNOSIS — R3911 Hesitancy of micturition: Secondary | ICD-10-CM | POA: Diagnosis not present

## 2022-01-07 DIAGNOSIS — R3912 Poor urinary stream: Secondary | ICD-10-CM | POA: Diagnosis not present

## 2022-01-29 DIAGNOSIS — E1169 Type 2 diabetes mellitus with other specified complication: Secondary | ICD-10-CM | POA: Diagnosis not present

## 2022-01-29 DIAGNOSIS — E119 Type 2 diabetes mellitus without complications: Secondary | ICD-10-CM | POA: Diagnosis not present

## 2022-01-29 DIAGNOSIS — Z79899 Other long term (current) drug therapy: Secondary | ICD-10-CM | POA: Diagnosis not present

## 2022-01-29 DIAGNOSIS — I2510 Atherosclerotic heart disease of native coronary artery without angina pectoris: Secondary | ICD-10-CM | POA: Diagnosis not present

## 2022-02-10 ENCOUNTER — Ambulatory Visit: Payer: Medicare Other | Admitting: Physician Assistant

## 2022-02-26 ENCOUNTER — Other Ambulatory Visit: Payer: Self-pay | Admitting: Cardiology

## 2022-02-26 ENCOUNTER — Telehealth: Payer: Self-pay | Admitting: Cardiology

## 2022-02-26 MED ORDER — NITROGLYCERIN 0.4 MG SL SUBL
0.4000 mg | SUBLINGUAL_TABLET | SUBLINGUAL | 5 refills | Status: AC | PRN
Start: 2022-02-26 — End: ?

## 2022-02-26 NOTE — Telephone Encounter (Signed)
Pt's medication was sent to pt's pharmacy as requested. Confirmation received.  °

## 2022-02-26 NOTE — Telephone Encounter (Signed)
*  STAT* If patient is at the pharmacy, call can be transferred to refill team.   1. Which medications need to be refilled? (please list name of each medication and dose if known)   nitroGLYCERIN (NITROSTAT) 0.4 MG SL tablet   2. Which pharmacy/location (including street and city if local pharmacy) is medication to be sent to? [place], [place] - [address]   3. Do they need a 30 day or 90 day supply? 30 day

## 2022-03-01 DIAGNOSIS — E1169 Type 2 diabetes mellitus with other specified complication: Secondary | ICD-10-CM | POA: Diagnosis not present

## 2022-03-01 DIAGNOSIS — I4891 Unspecified atrial fibrillation: Secondary | ICD-10-CM | POA: Diagnosis not present

## 2022-03-25 DIAGNOSIS — E7800 Pure hypercholesterolemia, unspecified: Secondary | ICD-10-CM | POA: Diagnosis not present

## 2022-03-25 DIAGNOSIS — I2510 Atherosclerotic heart disease of native coronary artery without angina pectoris: Secondary | ICD-10-CM | POA: Diagnosis not present

## 2022-03-25 DIAGNOSIS — E1169 Type 2 diabetes mellitus with other specified complication: Secondary | ICD-10-CM | POA: Diagnosis not present

## 2022-05-09 DIAGNOSIS — S0501XA Injury of conjunctiva and corneal abrasion without foreign body, right eye, initial encounter: Secondary | ICD-10-CM | POA: Diagnosis not present

## 2022-05-11 DIAGNOSIS — E1169 Type 2 diabetes mellitus with other specified complication: Secondary | ICD-10-CM | POA: Diagnosis not present

## 2022-05-11 DIAGNOSIS — I4891 Unspecified atrial fibrillation: Secondary | ICD-10-CM | POA: Diagnosis not present

## 2022-05-11 DIAGNOSIS — E7800 Pure hypercholesterolemia, unspecified: Secondary | ICD-10-CM | POA: Diagnosis not present

## 2022-05-11 DIAGNOSIS — I2510 Atherosclerotic heart disease of native coronary artery without angina pectoris: Secondary | ICD-10-CM | POA: Diagnosis not present

## 2022-05-12 DIAGNOSIS — S51802A Unspecified open wound of left forearm, initial encounter: Secondary | ICD-10-CM | POA: Diagnosis not present

## 2022-05-12 DIAGNOSIS — S0501XD Injury of conjunctiva and corneal abrasion without foreign body, right eye, subsequent encounter: Secondary | ICD-10-CM | POA: Diagnosis not present

## 2022-05-21 ENCOUNTER — Other Ambulatory Visit: Payer: Self-pay | Admitting: Cardiology

## 2022-05-24 ENCOUNTER — Other Ambulatory Visit: Payer: Self-pay | Admitting: Cardiology

## 2022-05-24 DIAGNOSIS — I2510 Atherosclerotic heart disease of native coronary artery without angina pectoris: Secondary | ICD-10-CM

## 2022-07-15 DIAGNOSIS — I25119 Atherosclerotic heart disease of native coronary artery with unspecified angina pectoris: Secondary | ICD-10-CM | POA: Diagnosis not present

## 2022-07-15 DIAGNOSIS — Z0001 Encounter for general adult medical examination with abnormal findings: Secondary | ICD-10-CM | POA: Diagnosis not present

## 2022-07-15 DIAGNOSIS — I4891 Unspecified atrial fibrillation: Secondary | ICD-10-CM | POA: Diagnosis not present

## 2022-07-15 DIAGNOSIS — Z79899 Other long term (current) drug therapy: Secondary | ICD-10-CM | POA: Diagnosis not present

## 2022-07-15 DIAGNOSIS — E1165 Type 2 diabetes mellitus with hyperglycemia: Secondary | ICD-10-CM | POA: Diagnosis not present

## 2022-07-15 DIAGNOSIS — E7800 Pure hypercholesterolemia, unspecified: Secondary | ICD-10-CM | POA: Diagnosis not present

## 2022-07-15 DIAGNOSIS — D6869 Other thrombophilia: Secondary | ICD-10-CM | POA: Diagnosis not present

## 2022-07-22 ENCOUNTER — Ambulatory Visit: Payer: Medicare Other | Attending: Cardiology | Admitting: Cardiology

## 2022-07-22 ENCOUNTER — Encounter: Payer: Self-pay | Admitting: Cardiology

## 2022-07-22 VITALS — BP 120/60 | HR 70 | Ht 68.0 in | Wt 144.4 lb

## 2022-07-22 DIAGNOSIS — E1122 Type 2 diabetes mellitus with diabetic chronic kidney disease: Secondary | ICD-10-CM

## 2022-07-22 DIAGNOSIS — I2510 Atherosclerotic heart disease of native coronary artery without angina pectoris: Secondary | ICD-10-CM | POA: Diagnosis not present

## 2022-07-22 DIAGNOSIS — R600 Localized edema: Secondary | ICD-10-CM

## 2022-07-22 DIAGNOSIS — E7800 Pure hypercholesterolemia, unspecified: Secondary | ICD-10-CM

## 2022-07-22 DIAGNOSIS — N181 Chronic kidney disease, stage 1: Secondary | ICD-10-CM

## 2022-07-22 NOTE — Patient Instructions (Signed)
Medication Instructions:  Your physician recommends that you continue on your current medications as directed. Please refer to the Current Medication list given to you today.  *If you need a refill on your cardiac medications before your next appointment, please call your pharmacy*   Lab Work: None.  If you have labs (blood work) drawn today and your tests are completely normal, you will receive your results only by: MyChart Message (if you have MyChart) OR A paper copy in the mail If you have any lab test that is abnormal or we need to change your treatment, we will call you to review the results.   Testing/Procedures: None.   Follow-Up: At [facility], you and your health needs are our priority.  As part of our continuing mission to provide you with exceptional heart care, we have created designated Provider Care Teams.  These Care Teams include your primary Cardiologist (physician) and Advanced Practice Providers (APPs -  Physician Assistants and Nurse Practitioners) who all work together to provide you with the care you need, when you need it.  We recommend signing up for the patient portal called "MyChart".  Sign up information is provided on this After Visit Summary.  MyChart is used to connect with patients for Virtual Visits (Telemedicine).  Patients are able to view lab/test results, encounter notes, upcoming appointments, etc.  Non-urgent messages can be sent to your provider as well.   To learn more about what you can do with MyChart, go to [email or web address].    Your next appointment:   1 year(s)  Provider:   Traci Turner, MD     

## 2022-07-22 NOTE — Progress Notes (Signed)
Date:  07/22/2022   ID:  [name], DOB [birth year], MRN [id number]   PCP:  Darrow Bussing, MD  Cardiologist:  Armanda Magic, MD  Electrophysiologist:  None   Chief Complaint:  CAD, Hyperlipidemia, DM  History of Present Illness:    [name] is a 82 y.o. male with a hx of ASCAD with one vessel CAD status post PCI of left circumflex and OM2 in 2009 and PCI of left circ 2010 for restenosis.  Stress Myoview 11/17 showed normal  EF 57% with no ischemia.  He also has a history of type 2 DM diet controlled and hyperlipidemia. Nuclear stress test 2023 for CP showed no ischemia.   He is here today for followup and is doing well.  He has a hx of chronic sporadic shooting sharp pain in the morning lasting a second and then resolves.  Occasionally he will have some mild LE edema. He denies any anginal chest  pressure, SOB, DOE, PND, orthopnea, dizziness, palpitations or syncope. He is compliant with his meds and is tolerating meds with no SE.      Prior CV studies:   The following studies were reviewed today:  EKG Interpretation Date/Time:  Thursday July 22 2022 11:06:30 EDT Ventricular Rate:  68 PR Interval:  184 QRS Duration:  86 QT Interval:  424 QTC Calculation: 450 R Axis:   54  Text Interpretation: Normal sinus rhythm Normal ECG When compared with ECG of 02-Apr-2008 05:11, No significant change was found Confirmed by Armanda Magic ([id number]) on 07/22/2022 11:18:56 AM     Past Medical History:  Diagnosis Date   Atypical mole 04/18/2002   slight to moderate mid back    Atypical mole 05/02/2007   right inner lower leg (moderate)   Basal cell carcinoma 08/06/2008   left ear (mohs)   BCC (basal cell carcinoma) 08/06/2008   right anterior sholder (cx65fu)    BCC (basal cell carcinoma) 03/14/2009   left post neck (clear)   BCC (basal cell carcinoma) 11/20/2013   right post sholder (cx77fu)   BCC (basal cell carcinoma) 02/23/2017   mid chest (cx14fu)    CAD (coronary  artery disease)    1 vessel CAD status post PCI of LCx and OM2 in 2009 and PCI of LCx 2010 for restenosis. Myoview 11/17: EF 57% with no ischemia   Colon polyp 2006   repeat in 2 years   COPD (chronic obstructive pulmonary disease) (HCC)    Diabetes mellitus without complication (HCC)    Diet-controlled non-insulin-dependent   Erectile dysfunction    History of tobacco abuse 04/2007   COPD on CXR   Hyperlipidemia LDL goal <70    Prostate cancer (HCC)    Squamous cell carcinoma of skin 12/11/2019   well diff- left eyebrown -MOHS   Past Surgical History:  Procedure Laterality Date   CATARACT EXTRACTION, BILATERAL  2 years ago   CYSTOSCOPY WITH LITHOLAPAXY N/A 09/21/2017   Procedure: CYSTOSCOPY WITH LITHOLAPAXY;  Surgeon: Crista Elliot, MD;  Location: [facility];  Service: Urology;  Laterality: N/A;   PERCUTANEOUS CORONARY STENT INTERVENTION (PCI-S)  05/2007   PCI of LCx 03/2008 for stenosis above prior stent   PROSTATE BIOPSY       Current Meds  Medication Sig   alum & mag hydroxide-simeth (MAALOX/MYLANTA) 200-200-20 MG/5ML suspension Take 15 mLs by mouth daily as needed for indigestion or heartburn.   aspirin EC 81 MG tablet Take 81 mg by mouth daily.   Carboxymethylcellul-Glycerin (  LUBRICATING EYE DROPS OP) Place 1 drop into both eyes daily as needed (dry eyes).   clopidogrel (PLAVIX) 75 MG tablet TAKE ONE TABLET EACH DAY   glimepiride (AMARYL) 4 MG tablet Take 4 mg by mouth daily with breakfast.   ibuprofen (ADVIL,MOTRIN) 200 MG tablet Take 200 mg by mouth 2 (two) times daily as needed for headache or moderate pain.   JARDIANCE 10 MG TABS tablet Take 10 mg by mouth daily.   ketoconazole (NIZORAL) 2 % shampoo Apply to scalp- let sit 3-5 minutes before rinsing   lisinopril (PRINIVIL,ZESTRIL) 2.5 MG tablet Take 2.5 mg by mouth every evening. Patient doesn't take this to manage BP. He takes this to prevent protein spillage.   metFORMIN (GLUCOPHAGE) 500 MG tablet Take 1,000 mg by mouth  2 (two) times daily with a meal.   nitroGLYCERIN (NITROSTAT) 0.4 MG SL tablet Place 1 tablet (0.4 mg total) under the tongue every 5 (five) minutes as needed.   rosuvastatin (CRESTOR) 20 MG tablet TAKE ONE TABLET EACH DAY   sildenafil (REVATIO) 20 MG tablet Take 40-60 mg by mouth daily as needed (erectile dysfunction).   tamsulosin (FLOMAX) 0.4 MG CAPS capsule Take 1 capsule (0.4 mg total) by mouth at bedtime.     Allergies:   Sulfa antibiotics and Zocor [simvastatin]   Social History   Tobacco Use   Smoking status: Former    Packs/day: 0.25    Years: 40.00    Additional pack years: 0.00    Total pack years: 10.00    Types: Cigarettes    Quit date: 12/21/2003    Years since quitting: 18.5   Smokeless tobacco: Never  Vaping Use   Vaping Use: Never used  Substance Use Topics   Alcohol use: Yes    Comment: ocassional beer or glass of wine   Drug use: Yes    Types: Marijuana    Comment: last use 2 weeks      Family Hx: The patient's family history includes Arthritis in his mother; Cancer in his sister; Diabetes in his mother; Heart attack in his father.  ROS:   Please see the history of present illness.     All other systems reviewed and are negative.   Labs/Other Tests and Data Reviewed:    Recent Labs: No results found for requested labs within last 365 days.   Recent Lipid Panel Lab Results  Component Value Date/Time   CHOL 111 02/26/2014 12:20 PM   TRIG 155 (H) 02/26/2014 12:20 PM   HDL 31 (L) 02/26/2014 12:20 PM   CHOLHDL 4.8 04/02/2008 06:10 AM   LDLCALC 49 02/26/2014 12:20 PM    Wt Readings from Last 3 Encounters:  07/22/22 144 lb 6.4 oz (65.5 kg)  03/24/21 145 lb (65.8 kg)  03/12/21 145 lb (65.8 kg)     Objective:    Vital Signs:  BP 120/60   Pulse 70   Ht 5\' 8"  (1.727 m)   Wt 144 lb 6.4 oz (65.5 kg)   SpO2 97%   BMI 21.96 kg/m   GEN: Well nourished, well developed in no acute distress HEENT: Normal NECK: No JVD; No carotid  bruits LYMPHATICS: No lymphadenopathy CARDIAC:RRR, no murmurs, rubs, gallops RESPIRATORY:  Clear to auscultation without rales, wheezing or rhonchi  ABDOMEN: Soft, non-tender, non-distended MUSCULOSKELETAL:  No edema; No deformity  SKIN: Warm and dry NEUROLOGIC:  Alert and oriented x 3 PSYCHIATRIC:  Normal affect   ASSESSMENT & PLAN:    1.  ASCAD  -1  vessel CAD s/p PCI of LCx and OM2 in 2009 and PCI of LCx 2010 for restenosis.  - Myoview 2017 and 2023 showed no ischemia -Continue prescription drug management with aspirin 81 mg daily, Plavix 75 mg daily, Crestor 20 mg daily with as needed refills  2.  Hyperlipidemia  - his LDL goal is < 70.  -I have personally reviewed and interpreted outside labs performed by patient's PCP which showed LDL 59 and HDL 41 on 07/15/2022 -Continue prescription drug management with Crestor 20 mg daily with as needed refills  3.  Type 2 DM  -HbA1C 7.3% in April 20 -Followed by PCP -Continue prescription drug manage meant with Jardiance 10 mg daily, metformin 5000 mg twice daily and glimepiride 4 mg daily with as needed refills -continue ACE I and statin  4.  LE edema -This is well controlled on low-sodium diet   Medication Adjustments/Labs and Tests Ordered: Current medicines are reviewed at length with the patient today.  Concerns regarding medicines are outlined above.  Tests Ordered: Orders Placed This Encounter  Procedures   EKG 12-Lead   Medication Changes: No orders of the defined types were placed in this encounter.   Disposition:  Follow up in 1 year(s)  Signed, Armanda Magic, MD  07/22/2022 11:22 AM    [facility]

## 2022-08-17 ENCOUNTER — Other Ambulatory Visit: Payer: Self-pay | Admitting: Cardiology

## 2022-08-27 ENCOUNTER — Other Ambulatory Visit: Payer: Self-pay

## 2022-08-27 ENCOUNTER — Encounter (HOSPITAL_COMMUNITY): Payer: Self-pay

## 2022-08-27 ENCOUNTER — Emergency Department (HOSPITAL_COMMUNITY)
Admission: EM | Admit: 2022-08-27 | Discharge: 2022-08-28 | Discharge status: Against Medical Advice | Payer: Medicare Other | Attending: Emergency Medicine | Admitting: Emergency Medicine

## 2022-08-27 DIAGNOSIS — R1310 Dysphagia, unspecified: Secondary | ICD-10-CM | POA: Diagnosis not present

## 2022-08-27 DIAGNOSIS — Z5321 Procedure and treatment not carried out due to patient leaving prior to being seen by health care provider: Secondary | ICD-10-CM | POA: Diagnosis not present

## 2022-08-27 NOTE — Progress Notes (Signed)
Received a call from patient from our answering service.  I then realized patient was already in emergency department.  Patient history of dysphagia with esophageal dilatation to 17 mm about one year ago in office.  Had been doing reasonably well until a couple days ago.  After working outside, and exposed to a lot of pollen, he developed sensation of swelling around back of mouth and throat.  He has had subsequent trouble with globus sensation and difficulty swallowing.  Upon extensive discussion, he denies that this started after eating specific meal.  I get the sense, after our phone conversation, that this is more of an allergen-mediated process.  I told patient that the Emergency Department staff would assess and see if anything further is needed at this point.  Based on my conversation with patient, I am not convinced he has esophageal food impaction.  If patient is felt to need admission for hydration or management of allergic reaction, we are happy to see patient in consult tomorrow.  Alternatively, we are happy to see reassess patient next week as outpatient in office.

## 2022-08-27 NOTE — ED Triage Notes (Signed)
Pt complaining of his throat is closing and he can not swallow.  He has had his esophagus stretched once last year. Is able to swallow water but no solids.

## 2022-09-29 DIAGNOSIS — E349 Endocrine disorder, unspecified: Secondary | ICD-10-CM | POA: Diagnosis not present

## 2022-10-25 ENCOUNTER — Other Ambulatory Visit: Payer: Self-pay | Admitting: Cardiology

## 2022-10-25 DIAGNOSIS — I2510 Atherosclerotic heart disease of native coronary artery without angina pectoris: Secondary | ICD-10-CM

## 2023-01-11 ENCOUNTER — Other Ambulatory Visit: Payer: Self-pay

## 2023-01-11 ENCOUNTER — Emergency Department (HOSPITAL_COMMUNITY): Payer: Medicare Other

## 2023-01-11 ENCOUNTER — Encounter (HOSPITAL_COMMUNITY): Payer: Self-pay

## 2023-01-11 ENCOUNTER — Emergency Department (HOSPITAL_COMMUNITY)
Admission: EM | Admit: 2023-01-11 | Discharge: 2023-01-11 | Disposition: A | Discharge status: Home / Self Care | Payer: Medicare Other | Attending: Emergency Medicine | Admitting: Emergency Medicine

## 2023-01-11 DIAGNOSIS — W44F3XA Food entering into or through a natural orifice, initial encounter: Secondary | ICD-10-CM | POA: Diagnosis not present

## 2023-01-11 DIAGNOSIS — T18128A Food in esophagus causing other injury, initial encounter: Secondary | ICD-10-CM | POA: Insufficient documentation

## 2023-01-11 DIAGNOSIS — W44F3XD Food entering into or through a natural orifice, subsequent encounter: Secondary | ICD-10-CM

## 2023-01-11 DIAGNOSIS — R1310 Dysphagia, unspecified: Secondary | ICD-10-CM | POA: Diagnosis not present

## 2023-01-11 LAB — CBC
HCT: 41.2 % (ref 39.0–52.0)
Hemoglobin: 14.4 g/dL (ref 13.0–17.0)
MCH: 31.6 pg (ref 26.0–34.0)
MCHC: 35 g/dL (ref 30.0–36.0)
MCV: 90.5 fL (ref 80.0–100.0)
Platelets: 258 10*3/uL (ref 150–400)
RBC: 4.55 MIL/uL (ref 4.22–5.81)
RDW: 12.1 % (ref 11.5–15.5)
WBC: 6.1 10*3/uL (ref 4.0–10.5)
nRBC: 0 % (ref 0.0–0.2)

## 2023-01-11 LAB — BASIC METABOLIC PANEL
Anion gap: 10 (ref 5–15)
BUN: 16 mg/dL (ref 8–23)
CO2: 22 mmol/L (ref 22–32)
Calcium: 9 mg/dL (ref 8.9–10.3)
Chloride: 104 mmol/L (ref 98–111)
Creatinine, Ser: 0.77 mg/dL (ref 0.61–1.24)
GFR, Estimated: >60 mL/min (ref >60)
Glucose, Bld: 92 mg/dL (ref 70–99)
Potassium: 4.1 mmol/L (ref 3.5–5.1)
Sodium: 136 mmol/L (ref 135–145)

## 2023-01-11 NOTE — ED Triage Notes (Addendum)
Patient said he has had trouble swallowing since this morning. Had a procedure last year to stretch esophagus. Painful to swallow water and saliva.

## 2023-01-11 NOTE — ED Provider Notes (Signed)
[place] [facility] DE[facility] AT [facility] Provider Note   CSN: [id number] Arrival date & time: 01/11/23  1400     History Chief Complaint  Patient presents with   Painful to Swallow    HPI [name] is a 82 y.o. male presenting for difficulty swallowing. States that he has a history of esophageal strictures.  Follows with [facility].  Had a dilation last year.  Had an exacerbation last week that was secondary to food intake. Had gotten better independently.  They have a scheduled appoint with GI tomorrow.  Again this morning after eating at day-old piece of bread he had a recurrent sensation of food impaction. It has now resolved after 2-hour wait in the emergency room.  Is been able to p.o. fluids and is requesting discharge since he is able to follow-up tomorrow.  Patient's recorded medical, surgical, social, medication list and allergies were reviewed in the Snapshot window as part of the initial history.   Review of Systems   Review of Systems  Constitutional:  Negative for chills and fever.  HENT:  Negative for ear pain and sore throat.   Eyes:  Negative for pain and visual disturbance.  Respiratory:  Negative for cough and shortness of breath.   Cardiovascular:  Negative for chest pain and palpitations.  Gastrointestinal:  Negative for abdominal pain and vomiting.  Genitourinary:  Negative for dysuria and hematuria.  Musculoskeletal:  Negative for arthralgias and back pain.  Skin:  Negative for color change and rash.  Neurological:  Negative for seizures and syncope.  All other systems reviewed and are negative.   Physical Exam Updated Vital Signs BP 136/74   Pulse 70   Temp 98.1 F (36.7 C) (Oral)   Resp 18   Ht 5\' 8"  (1.727 m)   Wt 63.5 kg   SpO2 100%   BMI 21.29 kg/m  Physical Exam Vitals and nursing note reviewed.  Constitutional:      General: He is not in acute distress.    Appearance: He is well-developed.  HENT:     Head:  Normocephalic and atraumatic.  Eyes:     Conjunctiva/sclera: Conjunctivae normal.  Cardiovascular:     Rate and Rhythm: Normal rate and regular rhythm.     Heart sounds: No murmur heard. Pulmonary:     Effort: Pulmonary effort is normal. No respiratory distress.     Breath sounds: Normal breath sounds.  Abdominal:     Palpations: Abdomen is soft.     Tenderness: There is no abdominal tenderness.  Musculoskeletal:        General: No swelling.     Cervical back: Neck supple.  Skin:    General: Skin is warm and dry.     Capillary Refill: Capillary refill takes less than 2 seconds.  Neurological:     Mental Status: He is alert.  Psychiatric:        Mood and Affect: Mood normal.      ED Course/ Medical Decision Making/ A&P    Procedures Procedures   Medications Ordered in ED Medications - No data to display  Medical Decision Making:   Patient presenting after spontaneous resolution of the food bolus. He now feels comfortable discharge.  Screening labs pointing triage without occult pathology including hematologic or metabolic pathology. X-ray without free air or evidence of perforation.  Given patient's p.o. tolerance at this time well appearance, will place him on a liquid diet recommend follow-up with PCP/GI tomorrow. Additional history collected  from patient's wife. Disposition:  I have considered need for hospitalization, however, considering all of the above, I believe this patient is stable for discharge at this time.  Patient/family educated about specific return precautions for given chief complaint and symptoms.  Patient/family educated about follow-up with PCP.     Patient/family expressed understanding of return precautions and need for follow-up. Patient spoken to regarding all imaging and laboratory results and appropriate follow up for these results. All education provided in verbal form with additional information in written form. Time was allowed for answering  of patient questions. Patient discharged.    Emergency Department Medication Summary:   Medications - No data to display   Clinical Impression:  1. Food impaction of esophagus, subsequent encounter      Discharge   Final Clinical Impression(s) / ED Diagnoses Final diagnoses:  Food impaction of esophagus, subsequent encounter    Rx / DC Orders ED Discharge Orders     None         Glyn Ade, MD 01/11/23 1649

## 2023-01-11 NOTE — ED Provider Triage Note (Signed)
Emergency Medicine Provider Triage Evaluation Note  [name] , a 82 y.o. male  was evaluated in triage.  Pt complains of odynophagia.  Patient states that symptoms started yesterday.  He has a history of similar symptoms several years ago that required a procedure done by gastroenterology.  He follows with [facility].  States that he has significant pain, per wife "doubles him over" with swallowing.  He will have some regurgitation but not always.  He continues to have pain with swallowing today, even small amounts of water.  No cough or difficulty breathing.  Review of Systems  Positive: Painful swallowing Negative: Fever  Physical Exam  BP 136/74   Pulse 70   Temp 98.1 F (36.7 C) (Oral)   Resp 18   Ht 5\' 8"  (1.727 m)   Wt 63.5 kg   SpO2 100%   BMI 21.29 kg/m  Gen:   Awake, no distress   Resp:  Normal effort  MSK:   Moves extremities without difficulty  Other:  Speaking in full sentences  Medical Decision Making  Medically screening exam initiated at 2:24 PM.  Appropriate orders placed.  KESTIN CONATSER was informed that the remainder of the evaluation will be completed by another provider, this initial triage assessment does not replace that evaluation, and the importance of remaining in the ED until their evaluation is complete.     Renne Crigler, PA-C 01/11/23 1425

## 2023-01-12 DIAGNOSIS — R1319 Other dysphagia: Secondary | ICD-10-CM | POA: Diagnosis not present

## 2023-01-12 DIAGNOSIS — Z8679 Personal history of other diseases of the circulatory system: Secondary | ICD-10-CM | POA: Diagnosis not present

## 2023-01-12 DIAGNOSIS — K222 Esophageal obstruction: Secondary | ICD-10-CM | POA: Diagnosis not present

## 2023-01-13 ENCOUNTER — Telehealth: Payer: Self-pay | Admitting: *Deleted

## 2023-01-13 ENCOUNTER — Telehealth: Payer: Self-pay

## 2023-01-13 DIAGNOSIS — E230 Hypopituitarism: Secondary | ICD-10-CM | POA: Diagnosis not present

## 2023-01-13 DIAGNOSIS — T18128D Food in esophagus causing other injury, subsequent encounter: Secondary | ICD-10-CM | POA: Diagnosis not present

## 2023-01-13 NOTE — Telephone Encounter (Signed)
  Patient Consent for Virtual Visit         [name] has provided verbal consent on 01/13/2023 for a virtual visit (video or telephone).   CONSENT FOR VIRTUAL VISIT FOR:  [name]  By participating in this virtual visit I agree to the following:  I hereby voluntarily request, consent and authorize [facility] and its employed or contracted physicians, physician assistants, nurse practitioners or other licensed health care professionals (the Practitioner), to provide me with telemedicine health care services (the "Services") as deemed necessary by the treating Practitioner. I acknowledge and consent to receive the Services by the Practitioner via telemedicine. I understand that the telemedicine visit will involve communicating with the Practitioner through live audiovisual communication technology and the disclosure of certain medical information by electronic transmission. I acknowledge that I have been given the opportunity to request an in-person assessment or other available alternative prior to the telemedicine visit and am voluntarily participating in the telemedicine visit.  I understand that I have the right to withhold or withdraw my consent to the use of telemedicine in the course of my care at any time, without affecting my right to future care or treatment, and that the Practitioner or I may terminate the telemedicine visit at any time. I understand that I have the right to inspect all information obtained and/or recorded in the course of the telemedicine visit and may receive copies of available information for a reasonable fee.  I understand that some of the potential risks of receiving the Services via telemedicine include:  Delay or interruption in medical evaluation due to technological equipment failure or disruption; Information transmitted may not be sufficient (e.g. poor resolution of images) to allow for appropriate medical decision making by the  Practitioner; and/or  In rare instances, security protocols could fail, causing a breach of personal health information.  Furthermore, I acknowledge that it is my responsibility to provide information about my medical history, conditions and care that is complete and accurate to the best of my ability. I acknowledge that Practitioner's advice, recommendations, and/or decision may be based on factors not within their control, such as incomplete or inaccurate data provided by me or distortions of diagnostic images or specimens that may result from electronic transmissions. I understand that the practice of medicine is not an exact science and that Practitioner makes no warranties or guarantees regarding treatment outcomes. I acknowledge that a copy of this consent can be made available to me via my patient portal [facility] MyChart), or I can request a printed copy by calling the office of [facility].    I understand that my insurance will be billed for this visit.   I have read or had this consent read to me. I understand the contents of this consent, which adequately explains the benefits and risks of the Services being provided via telemedicine.  I have been provided ample opportunity to ask questions regarding this consent and the Services and have had my questions answered to my satisfaction. I give my informed consent for the services to be provided through the use of telemedicine in my medical care

## 2023-01-13 NOTE — Telephone Encounter (Signed)
   Pre-operative Risk Assessment    Patient Name: [name]  DOB: 1940-09-02 MRN: [id number]      Request for Surgical Clearance    Procedure:   EGD  Date of Surgery:  Clearance 02/02/23                                 Surgeon:  DR. Levora Angel Surgeon's Group or Practice Name:  [facility] Phone number:  [phone] Fax number:  [phone]   Type of Clearance Requested:   - Medical  - Pharmacy:  Hold Clopidogrel (Plavix) X'S 5 DAYS   Type of Anesthesia:   PROPOFOL   Additional requests/questions:    Wilhemina Cash   01/13/2023, 9:12 AM

## 2023-01-13 NOTE — Telephone Encounter (Signed)
   Name: [name]  DOB: 1940/07/29  MRN: [id number]  Primary Cardiologist: Armanda Magic, MD   Preoperative team, please contact this patient and set up a phone call appointment for further preoperative risk assessment. Please obtain consent and complete medication review. Thank you for your help.  I confirm that guidance regarding antiplatelet and oral anticoagulation therapy has been completed and, if necessary, noted below.  Per protocol patient can hold Plavix 5 days prior to procedure and should restart postprocedure when surgically safe and hemostasis is achieved.  I also confirmed the patient resides in the state of [place]. As per [facility] Medical Board telemedicine laws, the patient must reside in the state in which the provider is licensed.   Napoleon Form, Leodis Rains, NP 01/13/2023, 9:22 AM [facility]

## 2023-01-13 NOTE — Telephone Encounter (Signed)
Patient scheduled for tele visit on 01/27/23. Med rec and consent done

## 2023-01-27 ENCOUNTER — Encounter: Payer: Self-pay | Admitting: Nurse Practitioner

## 2023-01-27 ENCOUNTER — Ambulatory Visit: Payer: Medicare Other | Attending: Nurse Practitioner | Admitting: Nurse Practitioner

## 2023-01-27 DIAGNOSIS — Z01810 Encounter for preprocedural cardiovascular examination: Secondary | ICD-10-CM

## 2023-01-27 NOTE — Progress Notes (Signed)
 Virtual Visit via Telephone Note   Because of [name]'s co-morbid illnesses, he is at least at moderate risk for complications without adequate follow up.  This format is felt to be most appropriate for this patient at this time.  The patient did not have access to video technology/had technical difficulties with video requiring transitioning to audio format only (telephone).  All issues noted in this document were discussed and addressed.  No physical exam could be performed with this format.  Please refer to the patient's chart for his consent to telehealth for [facility].  Evaluation Performed:  Preoperative cardiovascular risk assessment _____________   Date:  01/27/2023   Patient ID:  [name], DOB 04-18-1940, MRN [id number] Patient Location:  Home Provider location:   Office  Primary Care Provider:  Regino Slater, MD Primary Cardiologist:  Wilbert Bihari, MD  Chief Complaint / Patient Profile   83 y.o. y/o male with a h/o CAD s/p PCI of left circumflex and OM 2 in 2009 and PCI of left circumflex 2010 for restenosis, low risk stress Myoview  11/2015, low risk stress Myoview  2023, type II DM, hyperlipidemia who is pending EGD with Dr. Elicia and presents today for telephonic preoperative cardiovascular risk assessment.  History of Present Illness    [name] is a 83 y.o. male who presents via audio/video conferencing for a telehealth visit today.  Pt was last seen in cardiology clinic on 07/22/2022 by Dr. Bihari.  At that time ZAYED GRIFFIE was doing well.  The patient is now pending procedure as outlined above. Since his last visit, he denies chest pain, shortness of breath, lower extremity edema, fatigue, palpitations, melena, hematuria, hemoptysis, diaphoresis, weakness, presyncope, syncope, orthopnea, and PND. He remains active with regular yard and house work and is able to achieve > 4 METS without any concerning cardiac symptoms.   Past  Medical History    Past Medical History:  Diagnosis Date   Atypical mole 04/18/2002   slight to moderate mid back    Atypical mole 05/02/2007   right inner lower leg (moderate)   Basal cell carcinoma 08/06/2008   left ear (mohs)   BCC (basal cell carcinoma) 08/06/2008   right anterior sholder (cx47fu)    BCC (basal cell carcinoma) 03/14/2009   left post neck (clear)   BCC (basal cell carcinoma) 11/20/2013   right post sholder (cx21fu)   BCC (basal cell carcinoma) 02/23/2017   mid chest (cx55fu)    CAD (coronary artery disease)    1 vessel CAD status post PCI of LCx and OM2 in 2009 and PCI of LCx 2010 for restenosis. Myoview  11/17: EF 57% with no ischemia   Colon polyp 2006   repeat in 2 years   COPD (chronic obstructive pulmonary disease) (HCC)    Diabetes mellitus without complication (HCC)    Diet-controlled non-insulin -dependent   Erectile dysfunction    History of tobacco abuse 04/2007   COPD on CXR   Hyperlipidemia LDL goal <70    Prostate cancer (HCC)    Squamous cell carcinoma of skin 12/11/2019   well diff- left eyebrown -MOHS   Past Surgical History:  Procedure Laterality Date   CATARACT EXTRACTION, BILATERAL  2 years ago   CYSTOSCOPY WITH LITHOLAPAXY N/A 09/21/2017   Procedure: CYSTOSCOPY WITH LITHOLAPAXY;  Surgeon: Carolee Sherwood JONETTA DOUGLAS, MD;  Location: [facility];  Service: Urology;  Laterality: N/A;   PERCUTANEOUS CORONARY STENT INTERVENTION (PCI-S)  05/2007   PCI of LCx 03/2008  for stenosis above prior stent   PROSTATE BIOPSY      Allergies  Allergies  Allergen Reactions   Sulfa Antibiotics Other (See Comments)    Severe Headaches   Zocor [Simvastatin] Other (See Comments)    Aches and myalgias    Home Medications    Prior to Admission medications   Medication Sig Start Date End Date Taking? Authorizing Provider  alum & mag hydroxide-simeth (MAALOX/MYLANTA) 200-200-20 MG/5ML suspension Take 15 mLs by mouth daily as needed for indigestion or heartburn.     [provider]  aspirin EC 81 MG tablet Take 81 mg by mouth daily.    [provider]  Carboxymethylcellul-Glycerin (LUBRICATING EYE DROPS OP) Place 1 drop into both eyes daily as needed (dry eyes).    [provider]  clopidogrel  (PLAVIX ) 75 MG tablet TAKE ONE TABLET EACH DAY 10/25/22   Shlomo Wilbert SAUNDERS, MD  glimepiride (AMARYL) 4 MG tablet Take 4 mg by mouth daily with breakfast.    [provider]  ibuprofen (ADVIL,MOTRIN) 200 MG tablet Take 200 mg by mouth 2 (two) times daily as needed for headache or moderate pain.    [provider]  JARDIANCE 10 MG TABS tablet Take 10 mg by mouth daily. 09/03/19   [provider]  ketoconazole  (NIZORAL ) 2 % shampoo Apply to scalp- let sit 3-5 minutes before rinsing 02/10/21   Sheffield, Kelli R, PA-C  lisinopril (PRINIVIL,ZESTRIL) 2.5 MG tablet Take 2.5 mg by mouth every evening. Patient doesn't take this to manage BP. He takes this to prevent protein spillage.    [provider]  metFORMIN (GLUCOPHAGE) 500 MG tablet Take 1,000 mg by mouth 2 (two) times daily with a meal.    [provider]  nitroGLYCERIN  (NITROSTAT ) 0.4 MG SL tablet Place 1 tablet (0.4 mg total) under the tongue every 5 (five) minutes as needed. 02/26/22   Lucien Orren SAILOR, PA-C  rosuvastatin  (CRESTOR ) 20 MG tablet TAKE ONE TABLET EVERY DAY 08/17/22   Shlomo Wilbert SAUNDERS, MD  sildenafil (REVATIO) 20 MG tablet Take 40-60 mg by mouth daily as needed (erectile dysfunction).    [provider]  tamsulosin  (FLOMAX ) 0.4 MG CAPS capsule Take 1 capsule (0.4 mg total) by mouth at bedtime. 09/06/16   Lanell Donald Stagger, PA-C    Physical Exam    Vital Signs:  GIULIANO PREECE does not have vital signs available for review today.  Given telephonic nature of communication, physical exam is limited. AAOx3. NAD. Normal affect.  Speech and respirations are unlabored.  Accessory Clinical Findings    None  Assessment & Plan     1.  Preoperative Cardiovascular Risk Assessment: According to the Revised Cardiac Risk Index (RCRI), his Perioperative Risk of Major Cardiac Event is (%): 6.6. His Functional Capacity in METs is: 8.23 according to the Duke Activity Status Index (DASI). The patient is doing well from a cardiac perspective. Therefore, based on ACC/AHA guidelines, the patient would be at acceptable risk for the planned procedure without further cardiovascular testing.   The patient was advised that if he develops new symptoms prior to surgery to contact our office to arrange for a follow-up visit, and he verbalized understanding.  Per office protocol, he may hold Plavix  for 5 days prior to procedure and should resume as soon as hemodynamically stable postoperatively.  A copy of this note will be routed to requesting surgeon.  Time:   Today, I have spent 10 minutes with the patient with telehealth technology  discussing medical history, symptoms, and management plan.    Rosaline EMERSON Bane, NP-C  01/27/2023, 10:26 AM [address], [address] Office [phone] Fax [phone]

## 2023-01-31 DIAGNOSIS — R310 Gross hematuria: Secondary | ICD-10-CM | POA: Diagnosis not present

## 2023-02-02 DIAGNOSIS — R1310 Dysphagia, unspecified: Secondary | ICD-10-CM | POA: Diagnosis not present

## 2023-02-02 DIAGNOSIS — K449 Diaphragmatic hernia without obstruction or gangrene: Secondary | ICD-10-CM | POA: Diagnosis not present

## 2023-02-02 DIAGNOSIS — K222 Esophageal obstruction: Secondary | ICD-10-CM | POA: Diagnosis not present

## 2023-02-02 DIAGNOSIS — K317 Polyp of stomach and duodenum: Secondary | ICD-10-CM | POA: Diagnosis not present

## 2023-02-02 DIAGNOSIS — K3189 Other diseases of stomach and duodenum: Secondary | ICD-10-CM | POA: Diagnosis not present

## 2023-02-04 DIAGNOSIS — K3189 Other diseases of stomach and duodenum: Secondary | ICD-10-CM | POA: Diagnosis not present

## 2023-02-24 DIAGNOSIS — K5730 Diverticulosis of large intestine without perforation or abscess without bleeding: Secondary | ICD-10-CM | POA: Diagnosis not present

## 2023-02-24 DIAGNOSIS — R310 Gross hematuria: Secondary | ICD-10-CM | POA: Diagnosis not present

## 2023-02-24 DIAGNOSIS — K449 Diaphragmatic hernia without obstruction or gangrene: Secondary | ICD-10-CM | POA: Diagnosis not present

## 2023-03-08 DIAGNOSIS — R310 Gross hematuria: Secondary | ICD-10-CM | POA: Diagnosis not present

## 2023-06-25 DIAGNOSIS — I4891 Unspecified atrial fibrillation: Secondary | ICD-10-CM | POA: Diagnosis not present

## 2023-06-25 DIAGNOSIS — E1169 Type 2 diabetes mellitus with other specified complication: Secondary | ICD-10-CM | POA: Diagnosis not present

## 2023-06-25 DIAGNOSIS — E7800 Pure hypercholesterolemia, unspecified: Secondary | ICD-10-CM | POA: Diagnosis not present

## 2023-06-25 DIAGNOSIS — E1165 Type 2 diabetes mellitus with hyperglycemia: Secondary | ICD-10-CM | POA: Diagnosis not present

## 2023-07-27 DIAGNOSIS — Z0000 Encounter for general adult medical examination without abnormal findings: Secondary | ICD-10-CM | POA: Diagnosis not present

## 2023-07-27 DIAGNOSIS — E7800 Pure hypercholesterolemia, unspecified: Secondary | ICD-10-CM | POA: Diagnosis not present

## 2023-07-27 DIAGNOSIS — H9193 Unspecified hearing loss, bilateral: Secondary | ICD-10-CM | POA: Diagnosis not present

## 2023-07-27 DIAGNOSIS — Z79899 Other long term (current) drug therapy: Secondary | ICD-10-CM | POA: Diagnosis not present

## 2023-07-27 DIAGNOSIS — E1165 Type 2 diabetes mellitus with hyperglycemia: Secondary | ICD-10-CM | POA: Diagnosis not present

## 2023-07-27 DIAGNOSIS — I25119 Atherosclerotic heart disease of native coronary artery with unspecified angina pectoris: Secondary | ICD-10-CM | POA: Diagnosis not present

## 2023-08-18 ENCOUNTER — Other Ambulatory Visit: Payer: Self-pay | Admitting: Cardiology

## 2023-08-25 DIAGNOSIS — E7800 Pure hypercholesterolemia, unspecified: Secondary | ICD-10-CM | POA: Diagnosis not present

## 2023-08-25 DIAGNOSIS — E1169 Type 2 diabetes mellitus with other specified complication: Secondary | ICD-10-CM | POA: Diagnosis not present

## 2023-08-25 DIAGNOSIS — I4891 Unspecified atrial fibrillation: Secondary | ICD-10-CM | POA: Diagnosis not present

## 2023-08-25 DIAGNOSIS — E1165 Type 2 diabetes mellitus with hyperglycemia: Secondary | ICD-10-CM | POA: Diagnosis not present

## 2023-09-07 DIAGNOSIS — S70362A Insect bite (nonvenomous), left thigh, initial encounter: Secondary | ICD-10-CM | POA: Diagnosis not present

## 2023-09-07 DIAGNOSIS — W57XXXA Bitten or stung by nonvenomous insect and other nonvenomous arthropods, initial encounter: Secondary | ICD-10-CM | POA: Diagnosis not present

## 2023-09-07 DIAGNOSIS — R5383 Other fatigue: Secondary | ICD-10-CM | POA: Diagnosis not present

## 2023-09-14 DIAGNOSIS — D225 Melanocytic nevi of trunk: Secondary | ICD-10-CM | POA: Diagnosis not present

## 2023-09-14 DIAGNOSIS — L814 Other melanin hyperpigmentation: Secondary | ICD-10-CM | POA: Diagnosis not present

## 2023-09-14 DIAGNOSIS — L821 Other seborrheic keratosis: Secondary | ICD-10-CM | POA: Diagnosis not present

## 2023-09-14 DIAGNOSIS — L218 Other seborrheic dermatitis: Secondary | ICD-10-CM | POA: Diagnosis not present

## 2023-09-25 DIAGNOSIS — I4891 Unspecified atrial fibrillation: Secondary | ICD-10-CM | POA: Diagnosis not present

## 2023-09-25 DIAGNOSIS — E1165 Type 2 diabetes mellitus with hyperglycemia: Secondary | ICD-10-CM | POA: Diagnosis not present

## 2023-09-25 DIAGNOSIS — E7800 Pure hypercholesterolemia, unspecified: Secondary | ICD-10-CM | POA: Diagnosis not present

## 2023-09-25 DIAGNOSIS — E1169 Type 2 diabetes mellitus with other specified complication: Secondary | ICD-10-CM | POA: Diagnosis not present

## 2023-09-29 ENCOUNTER — Other Ambulatory Visit: Payer: Self-pay | Admitting: Cardiology

## 2023-10-04 DIAGNOSIS — Z23 Encounter for immunization: Secondary | ICD-10-CM | POA: Diagnosis not present

## 2023-10-14 ENCOUNTER — Other Ambulatory Visit: Payer: Self-pay | Admitting: Cardiology

## 2023-10-17 DIAGNOSIS — Z23 Encounter for immunization: Secondary | ICD-10-CM | POA: Diagnosis not present

## 2023-10-25 DIAGNOSIS — I4891 Unspecified atrial fibrillation: Secondary | ICD-10-CM | POA: Diagnosis not present

## 2023-10-25 DIAGNOSIS — E7800 Pure hypercholesterolemia, unspecified: Secondary | ICD-10-CM | POA: Diagnosis not present

## 2023-10-25 DIAGNOSIS — E1169 Type 2 diabetes mellitus with other specified complication: Secondary | ICD-10-CM | POA: Diagnosis not present

## 2023-10-25 DIAGNOSIS — E1165 Type 2 diabetes mellitus with hyperglycemia: Secondary | ICD-10-CM | POA: Diagnosis not present

## 2023-10-31 ENCOUNTER — Encounter: Payer: Self-pay | Admitting: Cardiology

## 2023-10-31 NOTE — Telephone Encounter (Signed)
 error

## 2023-11-01 ENCOUNTER — Ambulatory Visit: Attending: Physician Assistant | Admitting: Physician Assistant

## 2023-11-01 ENCOUNTER — Encounter: Payer: Self-pay | Admitting: Physician Assistant

## 2023-11-01 ENCOUNTER — Other Ambulatory Visit: Payer: Self-pay | Admitting: Cardiology

## 2023-11-01 VITALS — BP 98/50 | HR 65 | Ht 68.0 in | Wt 140.0 lb

## 2023-11-01 DIAGNOSIS — I2510 Atherosclerotic heart disease of native coronary artery without angina pectoris: Secondary | ICD-10-CM | POA: Diagnosis not present

## 2023-11-01 DIAGNOSIS — E7800 Pure hypercholesterolemia, unspecified: Secondary | ICD-10-CM

## 2023-11-01 DIAGNOSIS — E119 Type 2 diabetes mellitus without complications: Secondary | ICD-10-CM

## 2023-11-01 NOTE — Patient Instructions (Addendum)
 Medication Instructions:   START TAKING  1000 MG TWO TIMES A DAY OVER THE COUNTER FISH OIL   *If you need a refill on your cardiac medications before your next appointment, please call your pharmacy*    Lab Work: NONE ORDERED  TODAY     If you have labs (blood work) drawn today and your tests are completely normal, you will receive your results only by: MyChart Message (if you have MyChart) OR A paper copy in the mail If you have any lab test that is abnormal or we need to change your treatment, we will call you to review the results.    Testing/Procedures: NONE ORDERED  TODAY      Follow-Up:  At [facility], you and your health needs are our priority.  As part of our continuing mission to provide you with exceptional heart care, our providers are all part of one team.  This team includes your primary Cardiologist (physician) and Advanced Practice Providers or APPs (Physician Assistants and Nurse Practitioners) who all work together to provide you with the care you need, when you need it.   Your next appointment:   1 year(s)   Provider:    Wilbert Bihari, MD       We recommend signing up for the patient portal called MyChart.  Sign up information is provided on this After Visit Summary.  MyChart is used to connect with patients for Virtual Visits (Telemedicine).  Patients are able to view lab/test results, encounter notes, upcoming appointments, etc.  Non-urgent messages can be sent to your provider as well.   To learn more about what you can do with MyChart, go to [email or web address].   Other Instructions

## 2023-11-01 NOTE — Progress Notes (Unsigned)
  Cardiology Office Note   Date:  11/01/2023  ID:  [name], DOB 08-Apr-1940, MRN [id number] PCP: Regino Slater, MD  [facility] Providers Cardiologist:  Wilbert Bihari, MD { Click to update primary MD,subspecialty MD or APP then REFRESH:1}    History of Present Illness [name] is a 83 y.o. male with PMH of hyperlipidemia, DM 2, and CAD s/p LCx and OM2 in 2009 and PCI of left circumflex artery in 2010 for restenosis.  Stress Myoview  in November 2017 showed EF 57%, no ischemia.  Repeat Myoview  in 2023 showed no ischemia.  He has a history of chronic sporadic shooting sharp pain in the morning lasting a second that resolved.  He was last seen by Dr. Bihari in June 2024 at which time he was doing well.  Lower extremity edema was well-controlled on low-sodium diet.  Patient presents today for cardiology follow-up.  He denies any recent exertional chest pain or shortness of breath.  He is very active.  His blood pressure is borderline low at 98/50.  The only blood pressure medication he is on is 2.5 mg of lisinopril which he uses more for renal protection due to diabetes rather than blood pressure control.  Recent blood work showed hemoglobin A1c in July was 8.1.  He is triglyceride was 220, HDL borderline low at 37.  Total cholesterol and LDL are controlled.  I recommend he add 1000 mg twice a day of OTC fish oil.  EKG was reassuring.  He can follow-up with Dr. Bihari in 1 year.  ROS: ***  Studies Reviewed      *** Risk Assessment/Calculations {Does this patient have ATRIAL FIBRILLATION?:[phone]}         Physical Exam VS:  BP (!) 98/50 (BP Location: Left Arm, Patient Position: Sitting, Cuff Size: Normal)   Pulse 65   Ht 5' 8 (1.727 m)   Wt 140 lb (63.5 kg)   SpO2 92%   BMI 21.29 kg/m        Wt Readings from Last 3 Encounters:  11/01/23 140 lb (63.5 kg)  01/11/23 140 lb (63.5 kg)  08/27/22 142 lb (64.4 kg)    GEN: Well nourished, well developed in no acute  distress NECK: No JVD; No carotid bruits CARDIAC: ***RRR, no murmurs, rubs, gallops RESPIRATORY:  Clear to auscultation without rales, wheezing or rhonchi  ABDOMEN: Soft, non-tender, non-distended EXTREMITIES:  No edema; No deformity   ASSESSMENT AND PLAN ***    {Are you ordering a CV Procedure (e.g. stress test, cath, DCCV, TEE, etc)?   Press F2        :[id number]}  Dispo: ***  Signed, Scot Ford, PA

## 2023-11-02 ENCOUNTER — Other Ambulatory Visit: Payer: Self-pay | Admitting: Cardiology

## 2023-11-07 ENCOUNTER — Other Ambulatory Visit: Payer: Self-pay | Admitting: Cardiology

## 2023-11-07 DIAGNOSIS — I2510 Atherosclerotic heart disease of native coronary artery without angina pectoris: Secondary | ICD-10-CM

## 2023-11-15 DIAGNOSIS — E119 Type 2 diabetes mellitus without complications: Secondary | ICD-10-CM | POA: Diagnosis not present

## 2024-02-06 ENCOUNTER — Telehealth: Payer: Self-pay

## 2024-02-06 NOTE — Telephone Encounter (Signed)
 Shlomo Wilbert SAUNDERS, MD  Janit Geni CROME, RN  Please find out from [facility] where they got a dx of afib __we do not have that dx listed for this patient  Case Number:[id number]   Call to [facility] to explain that at patient's last visit on 11/01/2023 he was in normal sinus rhythm. We do not currently have afib as a diagnosis for him. Left detailed voice mail with call back number.

## 2024-02-09 ENCOUNTER — Other Ambulatory Visit: Payer: Self-pay | Admitting: Family Medicine

## 2024-02-09 DIAGNOSIS — R413 Other amnesia: Secondary | ICD-10-CM

## 2024-02-13 ENCOUNTER — Telehealth: Payer: Self-pay

## 2024-02-13 NOTE — Telephone Encounter (Signed)
" °  °  The following notification was received, called phone number given and left a message explaining we are not aware of an atrial fibrillation diagnosis and requesting more information about any need for anticoagulants. Contact information provided. Second attempt.   Dear WILBERT CHRISTELLA BIHARI MD [facility] administers a retrospective intervention program to promote the safe and appropriate use of medications for your patient who is a [employer] member. The report below identifies your patient with potential clinical concerns that require your attention.   This report does not take into account patient-specific variables that may factor into your prescribing decisions.   If you are already aware of the potential concern, please disregard this notice and continue to monitor your patient. Please contact the dispensing pharmacy directly if any of the following apply:    The patient is not under your care    You did not prescribe the identified medication(s)    You want to make changes to the patients medication therapy   If you have questions about this report, please contact [facility] at [phone]. Please reference the case or correspondence number. Thank you.    Sincerely,   [employer] Team   [employer]      Case Number:[id number]   Potential Clinical Concern:Gap in Care: Atrial fibrillation and not on an anticoagulant    "

## 2024-03-02 ENCOUNTER — Telehealth: Payer: Self-pay

## 2024-03-02 NOTE — Telephone Encounter (Signed)
 Received the following notice asking if patient should be on anticoagulation. Per Dr. Shlomo patient does not have a dx of afib and is not on anticoagulation. Left extensive voice mail explaining this.   I have also called and left messages on 02/06/24 and 02/13/24.  January 30, 2024   Re: Retrospective Intervention Program Potential Clinical Concern: Gap in Care: Atrial fibrillation and not on an anticoagulant   Dear TRACI M TURNER MD   [facility] administers a retrospective intervention program to promote the safe and appropriate use of medications for your patient who is a [employer] member. The report below identifies your patient with potential clinical concerns that require your attention.   This report does not take into account patient-specific variables that may factor into your prescribing decisions.   If you are already aware of the potential concern, please disregard this notice and continue to monitor your patient. Please contact the dispensing pharmacy directly if any of the following apply:    The patient is not under your care    You did not prescribe the identified medication(s)    You want to make changes to the patients medication therapy   If you have questions about this report, please contact [facility] at [phone]. Please reference the case or correspondence number. Thank you.    Sincerely,   The [employer] Team   [employer]
